# Patient Record
Sex: Female | Born: 1955 | Race: White | Hispanic: Yes | Marital: Single | Smoking: Never smoker
Health system: Southern US, Community
[De-identification: ages and names within clinical notes are randomized; demographics above are authoritative.]

## PROBLEM LIST (undated history)

## (undated) DIAGNOSIS — G43909 Migraine, unspecified, not intractable, without status migrainosus: Secondary | ICD-10-CM

## (undated) DIAGNOSIS — IMO0001 Reserved for inherently not codable concepts without codable children: Secondary | ICD-10-CM

## (undated) DIAGNOSIS — Z5189 Encounter for other specified aftercare: Secondary | ICD-10-CM

## (undated) DIAGNOSIS — I10 Essential (primary) hypertension: Secondary | ICD-10-CM

## (undated) DIAGNOSIS — O034 Incomplete spontaneous abortion without complication: Secondary | ICD-10-CM

## (undated) HISTORY — PX: TUBAL LIGATION: SHX77

## (undated) HISTORY — DX: Essential (primary) hypertension: I10

## (undated) HISTORY — DX: Reserved for inherently not codable concepts without codable children: IMO0001

## (undated) HISTORY — DX: Migraine, unspecified, not intractable, without status migrainosus: G43.909

## (undated) HISTORY — DX: Encounter for other specified aftercare: Z51.89

## (undated) HISTORY — DX: Incomplete spontaneous abortion without complication: O03.4

## (undated) HISTORY — PX: CRYOTHERAPY: SHX1416

## (undated) HISTORY — PX: DILATION AND CURETTAGE OF UTERUS: SHX78

---

## 1999-12-15 ENCOUNTER — Emergency Department (HOSPITAL_COMMUNITY): Admission: EM | Admit: 1999-12-15 | Discharge: 1999-12-15 | Payer: Self-pay | Admitting: Emergency Medicine

## 2000-04-02 ENCOUNTER — Other Ambulatory Visit: Admission: RE | Admit: 2000-04-02 | Discharge: 2000-04-02 | Payer: Self-pay | Admitting: Gynecology

## 2001-05-28 ENCOUNTER — Other Ambulatory Visit: Admission: RE | Admit: 2001-05-28 | Discharge: 2001-05-28 | Payer: Self-pay | Admitting: Gynecology

## 2003-11-26 ENCOUNTER — Other Ambulatory Visit: Admission: RE | Admit: 2003-11-26 | Discharge: 2003-11-26 | Payer: Self-pay | Admitting: Gynecology

## 2004-01-06 ENCOUNTER — Encounter: Admission: RE | Admit: 2004-01-06 | Discharge: 2004-01-06 | Payer: Self-pay | Admitting: Gynecology

## 2004-06-26 ENCOUNTER — Emergency Department (HOSPITAL_COMMUNITY): Admission: EM | Admit: 2004-06-26 | Discharge: 2004-06-26 | Payer: Self-pay | Admitting: Family Medicine

## 2005-01-12 ENCOUNTER — Other Ambulatory Visit: Admission: RE | Admit: 2005-01-12 | Discharge: 2005-01-12 | Payer: Self-pay | Admitting: Gynecology

## 2006-01-18 ENCOUNTER — Ambulatory Visit: Payer: Self-pay | Admitting: Family Medicine

## 2006-01-22 ENCOUNTER — Other Ambulatory Visit: Admission: RE | Admit: 2006-01-22 | Discharge: 2006-01-22 | Payer: Self-pay | Admitting: Gynecology

## 2006-03-20 ENCOUNTER — Ambulatory Visit: Payer: Self-pay | Admitting: Internal Medicine

## 2006-03-26 ENCOUNTER — Ambulatory Visit: Payer: Self-pay | Admitting: Cardiovascular Disease

## 2006-04-12 ENCOUNTER — Ambulatory Visit: Payer: Self-pay | Admitting: Internal Medicine

## 2006-06-11 HISTORY — PX: SHOULDER SURGERY: SHX246

## 2007-01-31 ENCOUNTER — Other Ambulatory Visit: Admission: RE | Admit: 2007-01-31 | Discharge: 2007-01-31 | Payer: Self-pay | Admitting: Gynecology

## 2007-02-25 ENCOUNTER — Ambulatory Visit (HOSPITAL_BASED_OUTPATIENT_CLINIC_OR_DEPARTMENT_OTHER): Admission: RE | Admit: 2007-02-25 | Discharge: 2007-02-26 | Payer: Self-pay | Admitting: Orthopedic Surgery

## 2008-02-04 ENCOUNTER — Other Ambulatory Visit: Admission: RE | Admit: 2008-02-04 | Discharge: 2008-02-04 | Payer: Self-pay | Admitting: Gynecology

## 2008-02-12 ENCOUNTER — Ambulatory Visit: Payer: Self-pay | Admitting: Gynecology

## 2008-04-23 ENCOUNTER — Ambulatory Visit: Payer: Self-pay | Admitting: Gynecology

## 2009-02-02 ENCOUNTER — Ambulatory Visit (HOSPITAL_COMMUNITY): Admission: RE | Admit: 2009-02-02 | Discharge: 2009-02-03 | Payer: Self-pay | Admitting: Orthopedic Surgery

## 2009-03-08 ENCOUNTER — Ambulatory Visit: Payer: Self-pay | Admitting: Gynecology

## 2009-03-08 ENCOUNTER — Encounter: Payer: Self-pay | Admitting: Gynecology

## 2009-03-08 ENCOUNTER — Other Ambulatory Visit: Admission: RE | Admit: 2009-03-08 | Discharge: 2009-03-08 | Payer: Self-pay | Admitting: Gynecology

## 2009-03-17 ENCOUNTER — Ambulatory Visit: Payer: Self-pay | Admitting: Gynecology

## 2009-09-13 ENCOUNTER — Ambulatory Visit: Payer: Self-pay | Admitting: Gynecology

## 2010-04-14 ENCOUNTER — Encounter (INDEPENDENT_AMBULATORY_CARE_PROVIDER_SITE_OTHER): Payer: Self-pay | Admitting: *Deleted

## 2010-04-14 LAB — CONVERTED CEMR LAB
AST: 23 units/L (ref 0–37)
Albumin: 4.4 g/dL (ref 3.5–5.2)
Alkaline Phosphatase: 69 units/L (ref 39–117)
Basophils Relative: 0 % (ref 0–1)
Eosinophils Absolute: 0 10*3/uL (ref 0.0–0.7)
Free Thyroxine Index: 2.8 (ref 1.0–3.9)
Glucose, Bld: 88 mg/dL (ref 70–99)
Lymphs Abs: 2.2 10*3/uL (ref 0.7–4.0)
MCV: 94 fL (ref 78.0–100.0)
Neutrophils Relative %: 55 % (ref 43–77)
Platelets: 259 10*3/uL (ref 150–400)
Potassium: 4.1 meq/L (ref 3.5–5.3)
Sodium: 143 meq/L (ref 135–145)
T3 Uptake Ratio: 37.1 % — ABNORMAL HIGH (ref 22.5–37.0)
T4, Total: 7.5 ug/dL (ref 5.0–12.5)
Total Protein: 7.2 g/dL (ref 6.0–8.3)
WBC: 5.8 10*3/uL (ref 4.0–10.5)

## 2010-04-25 ENCOUNTER — Ambulatory Visit (HOSPITAL_COMMUNITY): Admission: RE | Admit: 2010-04-25 | Discharge: 2010-04-25 | Payer: Self-pay | Admitting: Internal Medicine

## 2010-05-24 ENCOUNTER — Ambulatory Visit (HOSPITAL_COMMUNITY)
Admission: RE | Admit: 2010-05-24 | Discharge: 2010-05-24 | Payer: Self-pay | Source: Home / Self Care | Attending: Family Medicine | Admitting: Family Medicine

## 2010-05-24 ENCOUNTER — Encounter (INDEPENDENT_AMBULATORY_CARE_PROVIDER_SITE_OTHER): Payer: Self-pay | Admitting: *Deleted

## 2010-05-24 LAB — CONVERTED CEMR LAB: LDL Cholesterol: 109 mg/dL — ABNORMAL HIGH (ref 0–99)

## 2010-09-16 LAB — CBC
HCT: 41.8 % (ref 36.0–46.0)
MCHC: 33.8 g/dL (ref 30.0–36.0)
MCV: 92.9 fL (ref 78.0–100.0)
MCV: 94.1 fL (ref 78.0–100.0)
RBC: 4.42 MIL/uL (ref 3.87–5.11)
RBC: 4.44 MIL/uL (ref 3.87–5.11)
WBC: 5.5 10*3/uL (ref 4.0–10.5)

## 2010-09-16 LAB — BASIC METABOLIC PANEL
BUN: 6 mg/dL (ref 6–23)
CO2: 27 mEq/L (ref 19–32)
Chloride: 104 mEq/L (ref 96–112)
Chloride: 106 mEq/L (ref 96–112)
Creatinine, Ser: 0.67 mg/dL (ref 0.4–1.2)
GFR calc Af Amer: >60 mL/min (ref >60)
GFR calc Af Amer: >60 mL/min (ref >60)
Potassium: 4.4 mEq/L (ref 3.5–5.1)

## 2010-10-24 NOTE — Op Note (Signed)
NAMEAIVAH, PUTMAN                   ACCOUNT NO.:  [id number]   MEDICAL RECORD NO.:  [id number]          PATIENT TYPE:  OIB   LOCATION:  5024                         FACILITY:  [facility]   PHYSICIAN:  Alvy Beal, MD    DATE OF BIRTH:  [birth year]   DATE OF PROCEDURE:  02/02/2009  DATE OF DISCHARGE:                               OPERATIVE REPORT   PREOPERATIVE DIAGNOSIS:  Degenerative cervical disk disease C3-4, 4-5  with large C3-4 exostosis.   POSTOPERATIVE DIAGNOSIS:  Degenerative cervical disk disease C3-4, 4-5  with large C3-4 exostosis.   OPERATIVE PROCEDURE:  Anterior cervical diskectomy and fusion C3-4, 4-5.   COMPLICATIONS:  None.   CONDITION:  Stable.   FIRST ASSISTANT:  Crissie Reese, PA   HISTORY:  This is a very pleasant 55 year old woman who has had  longstanding cervical and left arm pain.  Attempts at conservative  management including physical therapy, activity modification, narcotic,  and nonnarcotic medications and injections had failed to alleviate her  symptoms.  After discussing treatment options, she elected to proceed  with surgery.  All appropriate risks, benefits, and alternatives were  discussed and consent was obtained.   OPERATIVE NOTE:  The patient was brought to the operating room, placed  supine on the operating table.  After successful induction of general  anesthesia endotracheal intubation, TED and SCDs were applied.  A towel  was placed between the shoulder blades and the arms were secured down  with tape.  The anterior cervical spine was prepped and draped in a  standard fashion.   At this point in time, appropriate preoperative time-out was done  confirming patient, procedure, and extremity of pain.  Once done, I made  a transverse incision centered at the L4 vertebral body.  Sharp  dissection was carried out down to and through the platysma.  I then  sharply dissected along the medial border of the sternocleidomastoid,  performing a  standard Clementeen Graham approach to the anterior cervical  spine.  I was able to release the omohyoid from its sleeve and thereby  prevented the need to sacrifice it.  I then swept the trachea and  esophagus medially with my finger, palpated and visualized the carotid  sheath laterally with my other finger and protected it.  At this point,  I could clearly visualize the anterior cervical spine.  I then placed a  needle into the C4-5 disk space and took an intraoperative x-ray.  Once  I confirmed this appropriate level, I began mobilizing the anterior  longitudinal ligament from the midbody of C4 up to the very large  anterior osteophyte (exostosis) at C3-4.  I swept the longus coli  muscles out so I could visualize the uncovertebral joints.  At this  point, I used a double-action Leksell rongeur to remove the large  exostosis at C3-4.  Once this was done, I was able to continue to  dissect the longus coli muscles.  At this point, I placed a self-  retaining retractor into the wound Osie Bond) deflated the endotracheal  cuff and expanded the blades  to the appropriate width.  I then  reexpanded the cuff and then began with the diskectomy.   A 15 blade scalpel was used to incise the annulus, then using a  combination of pituitary rongeurs, curettes, and Kerrison rongeurs, I  resected all the disk material at C3-4.  I also took down a significant  anterior overhanging osteophyte at the inferior aspect of C3.   I then used fine curettes to develop a plane underneath the posterior  longitudinal ligament and then using a 1-mm Kerrison, I resected the  posterior longitudinal ligament to expose the underlying thecal sac.  I  then resected the overhanging osteophytes at the uncovertebral joint  with a 1-mm Kerrison.  At this point with the decompression completed, I  then placed a third distraction pin into the body of C5 and distracted  the 4-5 disk space.  Using the same technique, I did a 3-4 and  performed  a complete diskectomy at C4-5.  Once both diskectomies are complete, the  posterior longitudinal ligaments were resected to adequately decompress  posteriorly.  I then measured the interbody space at 4-5 and placed a 6-  mm precut fibular allograft strut packed with Actifuse.  I had initially  measured the 3-4 to be 7 and I placed a 7-mm graft; however, I noted  that with the 7-mm graft, there was over distraction and it did push the  vertebral body into slight extension.  At this point because of the  deformity that the large graft placed, I removed it and then took a 6-mm  graft, packed it with Actifuse, and place it at C3-4.  I had better  overall cervical alignment with the 6-mm graft.  At this point, I then  irrigated the wound copiously with normal saline and removed the  distraction pins and used bone wax to obtain hemostasis at the  distraction pin holes.  I then contoured a 32-mm Synthes vector anterior  cervical plate and secured it to the vertebral bodies with 40-mm  variable angle screws.   At the end of the case, I swept the wound and lateral border of the  plate to ensure the esophagus was not entrapped beneath the plate.  I  copiously irrigated with normal saline and returned the trachea and  esophagus back to midline.  I then closed the platysma with interrupted  2-0 Vicryl sutures, and then the skin with a 3-0 Monocryl.  Steri-  Strips, dry dressing were applied.  The patient was extubated,  transferred to the PACU without incident.  At the end of the case, all  needle and sponge counts were correct.  The patient tolerated the  procedure well.  She was transferred to the PACU with an Aspen collar  on.  Instrumentation used was a Synthes anterior cervical vector plate  with 6-mm interbody grafts, precut fibular lordotic with 40-mm locking  screws.      Alvy Beal, MD  Electronically Signed     DDB/MEDQ  D:  02/02/2009  T:  02/03/2009  Job:   [id number]

## 2010-10-24 NOTE — Op Note (Signed)
NAMEGEET, HOSKING                   ACCOUNT NO.:  [id number]   MEDICAL RECORD NO.:  [id number]          PATIENT TYPE:  AMB   LOCATION:  DSC                          FACILITY:  [facility]   PHYSICIAN:  Marlowe Kays, M.D.  DATE OF BIRTH:  Nov 25, 1955   DATE OF PROCEDURE:  02/25/2007  DATE OF DISCHARGE:                               OPERATIVE REPORT   PREOPERATIVE DIAGNOSIS:  1. Degenerative arthritis acromioclavicular joint.  2  Torn rotator cuff, left shoulder.   POSTOPERATIVE DIAGNOSIS:  1. Degenerative arthritis acromioclavicular joint.  2  Torn rotator cuff, left shoulder.   OPERATION:  1. Open distal clavicle resection.  2. Anterior acromionectomy repair of torn rotator cuff, left shoulder.   SURGEON:  Marlowe Kays, M.D.   ASSISTANTDruscilla Brownie. Cherlynn June.   ANESTHESIA:  General.   PATHOLOGY/INDICATIONS FOR PROCEDURE:  She injured her left shoulder in  July of this year at work.  Because of persistent pain, we saw her on  January 15, 2007.  Her x-rays demonstrated a type 2 acromion with spurring  at the [facility] joint.  She was sent for an MRI which was performed on August  29, which demonstrated a rotator cuff tear, small in the full-thickness  of the anterior supraspinatus tendon along with AC joint degenerative  arthritis.  Because of the above, she is here today for the above-  mentioned surgery.  See operative description below for additional  details.   PROCEDURE:  Prophylactic antibiotics, satisfactory general anesthesia,  beach-chair position on the __________  frame.  Left  shoulder was  prepped with DuraPrep and draped in a sterile field.  IV employed.  Time-  out performed.  I made an incision over the distal clavicle and then  curving just past the anterior acromion. With subperiosteal dissection,  I isolated the AC joint and with subperiosteal dissection underneath the  distal clavicle, measured off a 1.5 from the [facility] joint, marked the  clavicle at this point  and then protecting the underlying tissues, used  a micro saw to remove the distal clavicle with towel clip and cutting  cautery technique.  Several small spicules of bone underneath the  surface of the remaining clavicle were excised with small rongeur.  I  then  extended my incision distally with subperiosteal dissection over  the anterior acromion.  She had a good bit of tightness with impingement  problem.  I placed a small Cobb elevator beneath the anterior acromion  and resected the initial portion of the anterior acromion, making two  further passes with a micro saw and rongeur to completely decompress the  anterior acromion.  She had a very thick bursa in keeping with the MRI  which I excised.  The rotator cuff tear was not immediately apparent.  The MRI had indicated it was somewhat lateral.  It did appear to be a  slight indented area near the greater tuberosity, but this was not  definite.  Accordingly I mixed 5 mL of methylene blue with 10 mL of  saline and injected through the posterior capsule with dye,  exiting in  the actual muscle of the supraspinatus tendon, more than in the tendon  itself, up beneath the acromion and distal clavicle resection site.  There was no evidence for any extravasation of dye lateralward in the  rotator cuff.  I then made further dissection of soft tissue beneath the  distal clavicle resection site.  She had a good bit of reparative type  tissue which would be consistent with a tear at this location and having  cleared that up and correcting some minor bleeding, I repaired the  defect as evidenced by methylene blue with multiple parallel sutures of  #1 Vicryl.  Then checked for impingement and there was no residual  impingement.  There was no unusual bleeding.  The wound was irrigated  well with sterile saline.  I had already placed bone wax over the distal  clavicle resection site.  I then placed Gelfoam in the area of the  distal clavicle  resection and closed the wound with interrupted #1  Vicryl in the fascia over the distal clavicle and anterior acromion, 2-0  Vicryl subcutaneous tissue.  Steri-Strips on the skin.  Dry sterile  dressing, shoulder immobilizer were applied.  She tolerated the  procedure well and was taken to recovery room in satisfactory condition.  There were no known complications.           ______________________________  Marlowe Kays, M.D.     JA/MEDQ  D:  02/25/2007  T:  02/25/2007  Job:  [id number]

## 2010-10-27 NOTE — Letter (Signed)
February 18, 2006     [facility]  [address]  [place], [place] [address]   RE:  Kelly Cruz, Kelly Cruz  MRN:  [id number]  /  DOB:  1955-10-24   Dear. Ms. Mena,   We have attempted several times to communicate with you regarding your  appointment with Cardiology.  We had to cancel the first appointment due to  the inability to contact you and, therefore, rescheduled an appointment for  February 08, 2006.  Your appointment will be with Dr. Antoine Poche at [address]; [place], [place].  Phone number is [phone].   If you are unable to keep this appointment, please call Cardiology to  reschedule the appointment.    Sincerely,      Leanne Chang, M.D.   LA/MedQ  DD:  02/18/2006  DT:  02/18/2006  Job #:  [id number]

## 2010-10-27 NOTE — Letter (Signed)
March 26, 2006     Leanne Chang, M.D.  [phone] [place], [place] [address].   RE:  ALAHNI, VARONE  MRN:  [id number]  /  DOB:  [birth year]   Dear Dr. Blossom Hoops:   It was my pleasure to see Kelly Cruz at the [facility] this  morning.  As you know, she is a very pleasant 55 year old woman originally  from [place] who presents for evaluation of chest pain and palpitations.  She complains of intermittent episodes of chest discomfort, shortness of  breath, and palpitations.  She does not have a prominent component of pain  in the chest, but does have an abnormal feeling there that seems to be  associated with palpitations.  Her symptoms are non-exertional in nature.  She has some dull discomfort in the chest on an intermittent basis.  She has  not had syncope, orthopnea, PND, edema, or claudication symptoms.  She has  not had any previous evaluation for cardiac disease and has no prior cardiac  history.  Kelly Cruz does not speak English, and translation was done through  her son, who was present with her today.  It was somewhat difficult to  elicit an accurate history on my evaluation today.   PAST MEDICAL HISTORY:  Pertinent only for migraine headaches.  She has had  no previous surgeries and has no other medical problems.   SOCIAL HISTORY:  The patient is originally from [place].  She lived in  [place] and has now been in [place] for several years.  She lives  locally in [place].  She does not smoke cigarettes, drink alcohol, or use  recreational drugs.  She does not drink caffeinated beverages.   FAMILY HISTORY:  Her mother is alive, but has heart problems.  Her father is  alive and has not had any problems related to his heart.  She has 7 siblings  who are healthy.   CURRENT MEDICATIONS:  1. Naproxen 500 mg as needed.  2. Ketoprofen 75 mg as needed.   Complete 12-point review of systems was performed.  The only pertinent  positive  was migraine headaches and occasional calf pain.  There are no  other positive findings to report.   PHYSICAL EXAMINATION:  The patient is alert and oriented.  She is in no  acute distress.  Her blood pressure is 140/72, heart rate 66.  Her weight is 154 pounds.  Respiratory rate is 12.  EYES:  Sclerae anicteric.  Conjunctivae pink.  Pupils equal, round, and  reactive to light.  ENT:  Moist oral mucosa.  Oropharynx is clear.  NECK:  Normal carotid upstrokes without bruits.  Jugular venous pressure is  normal.  LUNGS:  Clear to auscultation bilaterally.  CHEST:  There is a superficial nodule around the left axillary region.  This  feels more like a superficial cyst than a lymph node.  CARDIOVASCULAR:  The apex is discrete and nondisplaced.  The heart is  regular rate and rhythm without murmurs or gallops.  ABDOMEN:  Soft and nontender.  No organomegaly.  No abdominal bruits.  EXTREMITIES:  No cyanosis, clubbing, or edema.  Peripheral pulses are 2+ and  equal throughout.  SKIN:  Warm and dry.  LYMPHATICS:  There is no adenopathy.  NEUROLOGIC:  Strength is 5/5 and equal in the arms and legs bilaterally.   ELECTROCARDIOGRAM:  Normal sinus rhythm and it is within normal limits.   LABORATORY DATA:  Reviewed from August 10 of this  year.  She had a normal  CBC, as well as a normal comprehensive metabolic panel.  Her total  cholesterol was 180, her HDL was 51, and her LDL was 113.   ASSESSMENT:  Kelly Cruz is a 55 year old woman with a family history of  coronary artery disease, presenting with chest discomfort and palpitations.  My overall impression is that her pain is atypical and likely non-cardiac.  However, I do not feel confident based on our language barrier and my  difficulty obtaining an accurate history that she is not having any ischemic  cardiac pain.  With her family history, I would like to rule out significant  myocardial ischemia with an exercise Myoview stress test.  I will  plan on  following up with her by telephone after the results of this stress test are  completed.  If she has any abnormal findings, I will see her back in the  clinic.   Regarding the abnormal physical exam finding with the superficial cystic  mass in her left axilla, I have asked that she followup with you regarding  this.  I stressed the importance of this to both  her and her son.  They are going to contact your office for a followup  visit.  She first noticed this area approximately 3 years ago but forgot to  mention it to you on your initial exam.   Dr. Blossom Hoops, thanks once again for allowing me to evaluate Kelly Cruz.  I  will be in contact with you if there are any problems with her stress test.  Please feel free to call me at any time with questions regarding her care.    Sincerely,      Veverly Fells. Excell Seltzer, MD    MDC/MedQ  /  Job #:  [id number]  DD:  03/26/2006 / DT:  03/27/2006

## 2011-03-22 LAB — POCT HEMOGLOBIN-HEMACUE: Operator id: 123881

## 2011-04-26 ENCOUNTER — Encounter: Payer: Self-pay | Admitting: Gynecology

## 2011-04-27 ENCOUNTER — Other Ambulatory Visit (HOSPITAL_COMMUNITY)
Admission: RE | Admit: 2011-04-27 | Discharge: 2011-04-27 | Disposition: A | Discharge status: Home / Self Care | Payer: Medicare Other | Source: Ambulatory Visit | Attending: Gynecology | Admitting: Gynecology

## 2011-04-27 ENCOUNTER — Encounter: Payer: Self-pay | Admitting: Gynecology

## 2011-04-27 ENCOUNTER — Ambulatory Visit (INDEPENDENT_AMBULATORY_CARE_PROVIDER_SITE_OTHER): Payer: Medicare Other | Admitting: Gynecology

## 2011-04-27 VITALS — BP 154/92 | Ht 62.75 in | Wt 146.0 lb

## 2011-04-27 DIAGNOSIS — R82998 Other abnormal findings in urine: Secondary | ICD-10-CM

## 2011-04-27 DIAGNOSIS — Z1211 Encounter for screening for malignant neoplasm of colon: Secondary | ICD-10-CM

## 2011-04-27 DIAGNOSIS — R5381 Other malaise: Secondary | ICD-10-CM

## 2011-04-27 DIAGNOSIS — N9089 Other specified noninflammatory disorders of vulva and perineum: Secondary | ICD-10-CM

## 2011-04-27 DIAGNOSIS — R5383 Other fatigue: Secondary | ICD-10-CM

## 2011-04-27 DIAGNOSIS — E119 Type 2 diabetes mellitus without complications: Secondary | ICD-10-CM | POA: Insufficient documentation

## 2011-04-27 DIAGNOSIS — Z01419 Encounter for gynecological examination (general) (routine) without abnormal findings: Secondary | ICD-10-CM

## 2011-04-27 DIAGNOSIS — E559 Vitamin D deficiency, unspecified: Secondary | ICD-10-CM

## 2011-04-27 DIAGNOSIS — I10 Essential (primary) hypertension: Secondary | ICD-10-CM

## 2011-04-27 DIAGNOSIS — Z124 Encounter for screening for malignant neoplasm of cervix: Secondary | ICD-10-CM

## 2011-04-27 DIAGNOSIS — F329 Major depressive disorder, single episode, unspecified: Secondary | ICD-10-CM

## 2011-04-27 DIAGNOSIS — K921 Melena: Secondary | ICD-10-CM

## 2011-04-27 LAB — HEMOGLOBIN A1C: Mean Plasma Glucose: 117 mg/dL — ABNORMAL HIGH (ref <117)

## 2011-04-27 NOTE — Progress Notes (Signed)
Kelly Cruz 1955-10-11 [id number]   History:    55 y.o.  for annual exam with several complaints. Patient noted a vulvar growth recently and wanted to have it checked she has not seen her primary physician at the [facility] department in over year. Patient has history of hypertension for which she did not take her medicine today. Has a history of type 2 diabetes. And also to take nonsteroidals for joint pains. Review of her record indicated her last mammogram was in 2010 her last bone density study was normal in 2010. She has not had a colonoscopy yet. Patient also complaining of dryness fatigue and depressed and lack of energy. Patient did not have her list of her medications that she's on and did not recall the name of such medications for hypertension and her diabetes. She also has had history of vitamin D deficiency in the past.  Past medical history,surgical history, family history and social history were all reviewed and documented in the EPIC chart.  Gynecologic History Patient's last menstrual period was 04/26/2005. Contraception: none Last Pap: 2010. Results were:normal} Last mammogram: 2010. Results were: normal  Obstetric History OB History    Grav Para Term Preterm Abortions TAB SAB Ect Mult Living   10 6 5  4     6      # Outc Date GA Lbr Len/2nd Wgt Sex Del Anes PTL Lv   1 TRM     M SVD  No Yes   2 TRM     M SVD  No Yes   3 TRM     M SVD  No Yes   4 TRM     M SVD  No Yes   5 TRM     F SVD  No Yes   6 PAR     M SVD  No Yes   Comments: BABY DIED SAME DAY..   7 ABT            8 ABT            9 ABT            10 ABT                ROS:  Was performed and pertinent positives and negatives are included in the history.  Exam: chaperone present  BP 154/92  Ht 5' 2.75" (1.594 m)  Wt 146 lb (66.225 kg)  BMI 26.07 kg/m2  LMP 04/26/2005  Body mass index is 26.07 kg/(m^2).  General appearance : Well developed well nourished female. No acute distress HEENT: Neck supple,  trachea midline, no carotid bruits, no thyroidmegaly Lungs: Clear to auscultation, no rhonchi or wheezes, or rib retractions  Heart: Regular rate and rhythm, no murmurs or gallops Breast:Examined in sitting and supine position were symmetrical in appearance, no palpable masses or tenderness,  no skin retraction, no nipple inversion, no nipple discharge, no skin discoloration, no axillary or supraclavicular lymphadenopathy Abdomen: no palpable masses or tenderness, no rebound or guarding Extremities: no edema or skin discoloration or tenderness  Pelvic:  Bartholin, Urethra, Skene Glands: Within normal limits             Vagina: An apparent fibroepithelial skin tag on the             upper portion of the right labia majora.  Cervix: No gross lesions or discharge  Uterus  anteverted, normal size, shape and  consistency, non-tender and mobile  Adnexa  Without masses or tenderness  Anus and perineum  normal   Rectovaginal  normal sphincter tone without palpated masses or tenderness             Hemoccult obtained results pending at time of this dictation     Assessment/Plan:  55 y.o. female  with a fibroepithelial skin tag in the upper portion of the right labia majora which patient would like to have removed. She will schedule that in the next couple weeks. Patient with history of hypertension and type 2 diabetes did not take her medications today. Patient did not bring her list of medications that she's on. She states that she's been followed at the [facility] department but has not seen her provider in a year. She's also feels depressed tired and lack of energy. No suicidal deviation. Will check the following labs: TSH, hemoglobin A1c, CBC, urinalysis, vitamin D level (history vitamin D deficiency) fecal occult blood testing along with her Pap smear. A requisition will be given to her to schedule her bone density study here in the office as well and the names of colleagues to  schedule her colonoscopy as well as a family physician or internist to follow her  for her diabetes and hypertension.   Ok Edwards MD, 4:11 PM 04/27/2011

## 2011-05-02 ENCOUNTER — Encounter: Payer: Self-pay | Admitting: Gynecology

## 2011-05-10 ENCOUNTER — Ambulatory Visit (INDEPENDENT_AMBULATORY_CARE_PROVIDER_SITE_OTHER): Payer: Medicare Other | Admitting: Gynecology

## 2011-05-10 ENCOUNTER — Encounter: Payer: Self-pay | Admitting: Gynecology

## 2011-05-10 VITALS — BP 152/90

## 2011-05-10 DIAGNOSIS — N9089 Other specified noninflammatory disorders of vulva and perineum: Secondary | ICD-10-CM

## 2011-05-10 NOTE — Progress Notes (Signed)
55 year old patient who was seen for her gynecological examination last week had brought to my attention she was concerned of a vulvar lesion that she had noted for the past year. Her recent lab work in the office were as follows: TSH, hemoglobin A1c, CBC, urinalysis, and Pap smear were normal. Her vitamin D was slightly low and she was instructed to start taking vitamin D 3 2000 units daily. Patient with history of hypertension had been seen at [facility] by a Dr. Clelia Croft and she has not followed up and quite some time. Also during her annual exam her fecal occult blood testing was found to be positive and we are going to make arrangements for her to see a gastroenterologist. She has not had a bone density study and the will be scheduled here in our office the next week or so.  Vulvar examination with the following findings:  Physical Exam  Genitourinary:      the area was cleansed with Betadine solution and 1% lidocaine was infiltrated at the base a small elliptical incision was made to excise the fibroepithelial growth and submitted for histological evaluation. Silver nitrate was used for hemostasis and the edges of the incision were reapproximated with 2 interrupted sutures of 3-0 Vicryl. 1% lidocaine was applied topically. She'll return back next week to remove her sutures. I've given her name was of some my colleagues in the community [facility]) so that they can follow her for her hypertension. Will make arrangements for her to see a gastroenterologist for screening colonoscopy as well as because of her recent positive fecal occult blood testing. All the above was discussed with the patient Spanish and we'll follow accordingly.

## 2011-05-11 ENCOUNTER — Telehealth: Payer: Self-pay | Admitting: *Deleted

## 2011-05-11 ENCOUNTER — Encounter: Payer: Self-pay | Admitting: Internal Medicine

## 2011-05-11 ENCOUNTER — Other Ambulatory Visit: Payer: Self-pay | Admitting: *Deleted

## 2011-05-11 DIAGNOSIS — R195 Other fecal abnormalities: Secondary | ICD-10-CM

## 2011-05-11 NOTE — Telephone Encounter (Signed)
Message copied by Libby Maw on Fri May 11, 2011  3:47 PM ------      Message from: Ok Edwards      Created: Thu May 10, 2011  5:40 PM       Khriz Liddy, I need to make an appointment for this patient for her colonoscopy she is a medicated patient. Try to check first with a [facility] and then keep checking to see who would see her. She is a screening colonoscopy but also she had positive Hemoccult stool in the office recently.

## 2011-05-11 NOTE — Telephone Encounter (Signed)
Patient called by [facility] CMA.  Informed appointment information, Nurse visit 05/18/11 @2 :30pm and Colonoscopy 06/01/11 @3 :00pm with Dr. Lina Sar.

## 2011-05-16 ENCOUNTER — Ambulatory Visit (INDEPENDENT_AMBULATORY_CARE_PROVIDER_SITE_OTHER): Payer: Medicare Other | Admitting: Gynecology

## 2011-05-16 ENCOUNTER — Encounter: Payer: Self-pay | Admitting: Gynecology

## 2011-05-16 VITALS — BP 132/86

## 2011-05-16 DIAGNOSIS — Z4802 Encounter for removal of sutures: Secondary | ICD-10-CM

## 2011-05-16 DIAGNOSIS — D229 Melanocytic nevi, unspecified: Secondary | ICD-10-CM

## 2011-05-16 DIAGNOSIS — D239 Other benign neoplasm of skin, unspecified: Secondary | ICD-10-CM

## 2011-05-16 NOTE — Progress Notes (Signed)
Patient presented to the office today to remove sutures from the previous excised vulvar lesion on November 29. The area that was excised was a growth on the upper aspect of the right labia majora see detail picture of previous office visit. Pathology report as follows:  FINAL DIAGNOSIS Diagnosis Vulva, biopsy, right labia majora - MELANOCYTIC NEVUS, COMPOUND TYPE, MARGIN INVOLVED   There is a proliferation of benign melanocytes at the dermal epidermal junction arranged mostly in nests with nevus cells in the dermis, which are generally smaller as they are deeper. There is no significant atypia. The lesion extends to the biopsy margin  Sutures were removed areas healing nicely she's otherwise scheduled to return back next year or when necessary. It was stressed upon her to followup with her family to continue to monitor her hypertension as well as to schedule her mammogram and colonoscopy as previously discussed. All the above was discussed in Spanish and we'll follow accordingly.

## 2011-05-24 ENCOUNTER — Ambulatory Visit (AMBULATORY_SURGERY_CENTER): Payer: Medicare Other | Admitting: *Deleted

## 2011-05-24 VITALS — Ht 65.0 in | Wt 150.0 lb

## 2011-05-24 DIAGNOSIS — Z1211 Encounter for screening for malignant neoplasm of colon: Secondary | ICD-10-CM

## 2011-05-24 MED ORDER — PEG-KCL-NACL-NASULF-NA ASC-C 100 G PO SOLR: ORAL | Status: DC

## 2011-05-24 NOTE — Progress Notes (Signed)
Pt. Became flushed at end of previsit.  Gave her breakfast bar and water.  Instructed her to sit in waiting area or car and rest.  If still feelling light headed to call family to come drive her home and if need to go to ER.  All was explained to pt via interpreter.  Pt. Chose to leave in elevator with interpreter.

## 2011-05-28 IMAGING — RF DG CERVICAL SPINE 2 OR 3 VIEWS
1 series · 3 of 3 positions shown · non-contrast
Comparison: Cervical spine radiographs 01/31/2009

CLINICAL DATA: Neck pain.  Cervical fusion.

CERVICAL SPINE - 2-3 VIEW

[Series 1: run · 3 of 3 slices shown]
[im 1/3]
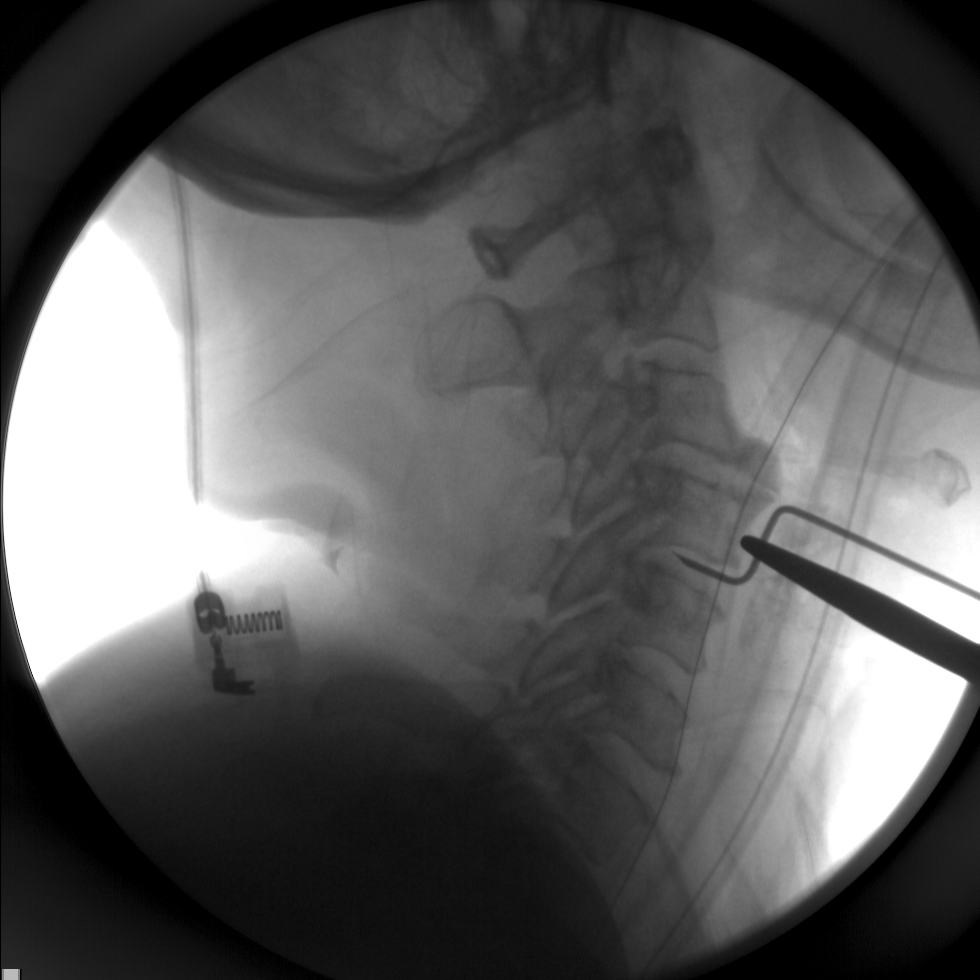
[im 2/3]
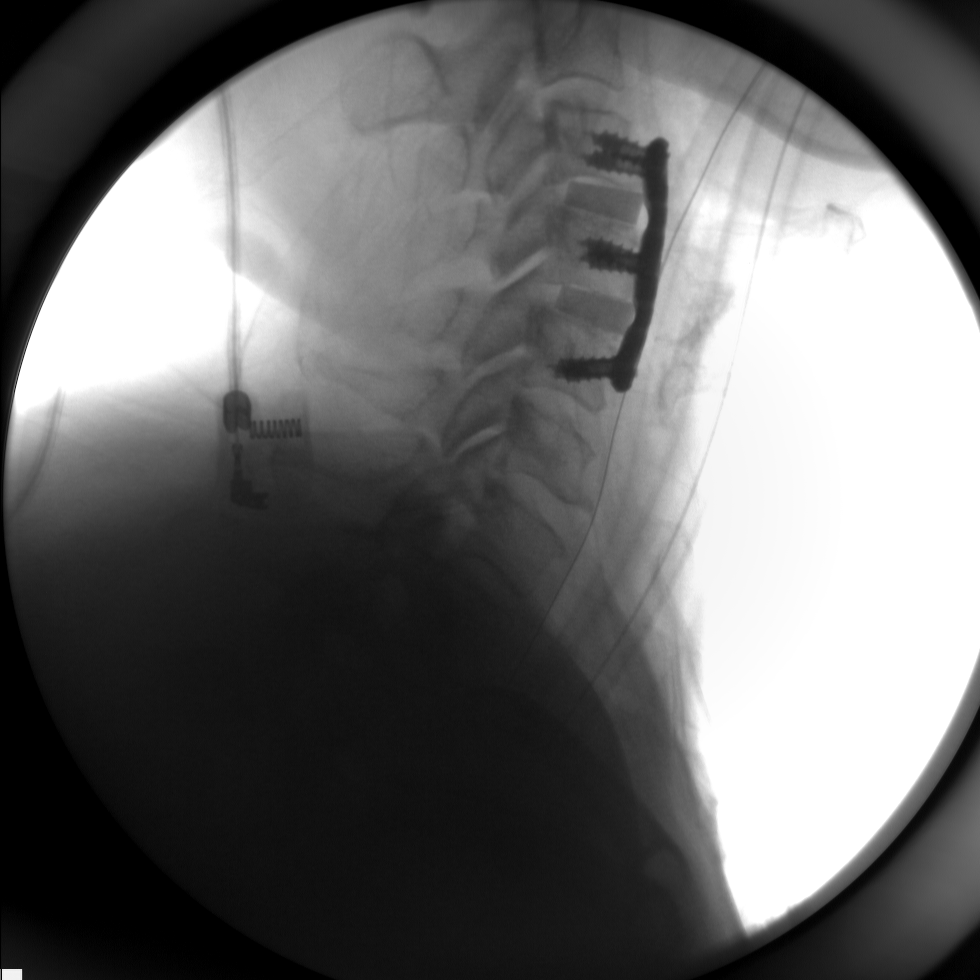
[im 3/3]
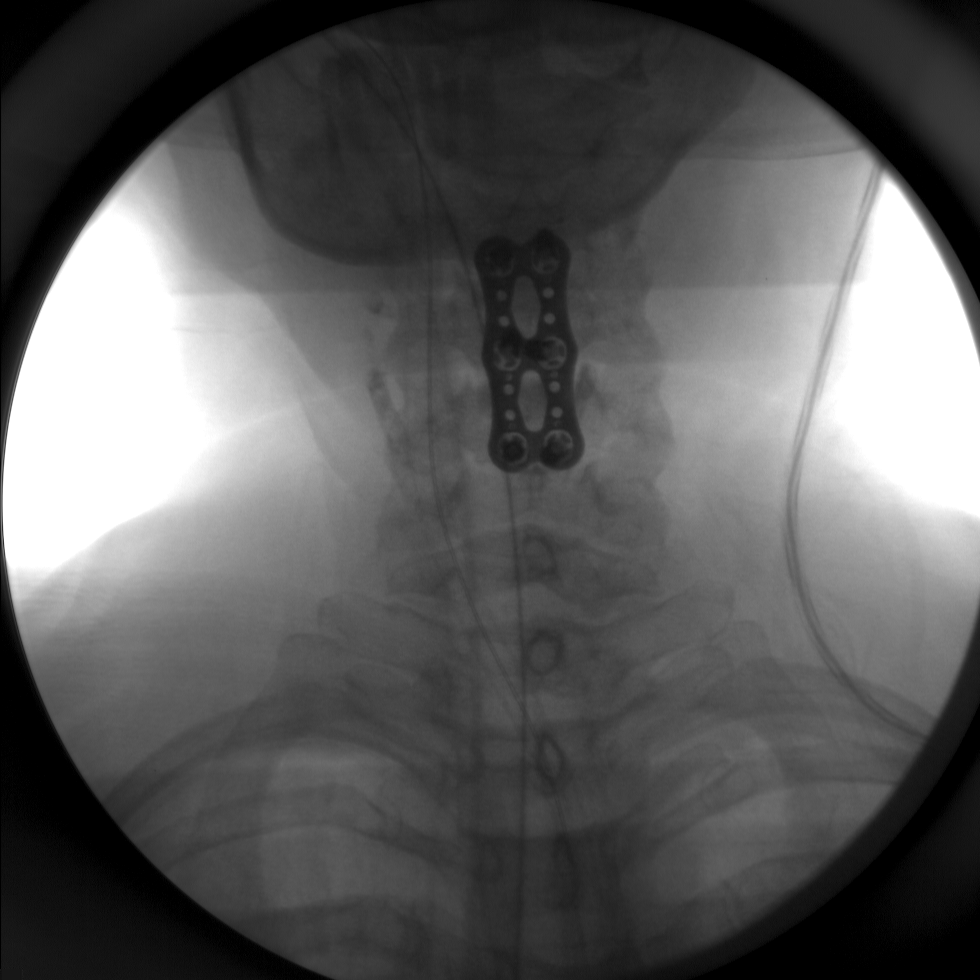

[3 of 3 positions shown; findings below may reference images not displayed]

FINDINGS: The film labeled #1 demonstrates a spinal needle
projecting at the C4-5 interspace.

Films labeled #2 and 3 demonstrate anterior plate screws and
interbody bone plugs fusing C3-C5.  No complicating features are
demonstrated.
IMPRESSION: C3-C5 anterior and interbody fusion.

## 2011-06-01 ENCOUNTER — Encounter: Payer: Self-pay | Admitting: Internal Medicine

## 2011-06-01 ENCOUNTER — Ambulatory Visit (AMBULATORY_SURGERY_CENTER): Payer: Medicare Other | Admitting: Internal Medicine

## 2011-06-01 VITALS — BP 121/73 | HR 72 | Temp 98.9°F | Resp 13 | Ht 65.0 in | Wt 150.0 lb

## 2011-06-01 DIAGNOSIS — D126 Benign neoplasm of colon, unspecified: Secondary | ICD-10-CM

## 2011-06-01 DIAGNOSIS — Z1211 Encounter for screening for malignant neoplasm of colon: Secondary | ICD-10-CM

## 2011-06-01 MED ORDER — SODIUM CHLORIDE 0.9 % IV SOLN: 500.0000 mL | INTRAVENOUS | Status: DC

## 2011-06-01 NOTE — Progress Notes (Signed)
INTERPRETER PRESENT AND CAREGIVER, ONE GENTLEMAN WHO SPOKE FLUENT ENGLISH. DISCHARGE INSTRUCTIONS GIVEN IN SPANISH.PATIENT DISCHARGED COMFORTABLE WITHOUT COMPLAINTS.

## 2011-06-01 NOTE — Patient Instructions (Signed)
FOLLOW YOUR DISCHARGE INSTRUCTIONS.   CONTINUE YOUR MEDICATIONS.  AWAIT BIOPSY RESULTS. 

## 2011-06-01 NOTE — Op Note (Signed)
[facility] [address] [place], [place]  [address]  COLONOSCOPY PROCEDURE REPORT  PATIENT:  Kelly Cruz, Kelly Cruz  MR#:  [id number] BIRTHDATE:  06-12-1955, 55 yrs. old  GENDER:  female ENDOSCOPIST:  Hedwig Morton. Juanda Chance, MD REF. BY:  Reynaldo Minium, M.D. PROCEDURE DATE:  06/01/2011 PROCEDURE:  Colonoscopy with biopsy ASA CLASS:  Class I INDICATIONS:  Routine Risk Screening MEDICATIONS:   These medications were titrated to patient response per physician's verbal order, Versed 7 mg, Fentanyl 75 mcg  DESCRIPTION OF PROCEDURE:   After the risks and benefits and of the procedure were explained, informed consent was obtained. Digital rectal exam was performed and revealed no rectal masses. The LB PCF-H180AL [id number] endoscope was introduced through the anus and advanced to the cecum, which was identified by both the appendix and ileocecal valve.  The quality of the prep was good, using MoviPrep.  The instrument was then slowly withdrawn as the colon was fully examined. <<PROCEDUREIMAGES>>  FINDINGS:  A sessile polyp was found. at 20 cm 8 mm polyp The polyp was removed using cold biopsy forceps (see image7 and image5).  Moderate diverticulosis was found in the sigmoid colon (see image1).  This was otherwise a normal examination of the colon (see image2, image3, image4, and image8).   Retroflexed views in the rectum revealed no abnormalities.    The scope was then withdrawn from the patient and the procedure completed.  COMPLICATIONS:  None ENDOSCOPIC IMPRESSION: 1) Sessile polyp 2) Moderate diverticulosis in the sigmoid colon 3) Otherwise normal examination RECOMMENDATIONS: 1) Await pathology results 2) High fiber diet.  REPEAT EXAM:  In 5 - 7 year(s) for.  5 year recall if polyp adenomatous  ______________________________ Hedwig Morton. Juanda Chance, MD  CC:  n. eSIGNED:   Hedwig Morton. Keighley Deckman at 06/01/2011 04:15 PM  Jamison Neighbor, [id number]

## 2011-06-01 NOTE — Progress Notes (Signed)
Patient did not experience any of the following events: a burn prior to discharge; a fall within the facility; wrong site/side/patient/procedure/implant event; or a hospital transfer or hospital admission upon discharge from the facility. (G8907) Patient did not have preoperative order for IV antibiotic SSI prophylaxis. (G8918)  

## 2011-06-01 NOTE — Progress Notes (Signed)
Pt speaks spanish.  An interpreter from the interpreter service in the procedure room to explain what we were doing.  She was present for the time out and answered questions for the pt.  Maw  Intra procedure the pt's sats dropped to 81%.  Pt encourage to take deep "respira".  Probe was repositioned on her finger sats increased to 85%.  O2 nasal canula to 4 liters nasal canula.  Pt's sats increased to 95%.  Maw  The pt was uncomfortable while the scope was being advanced.  Medications were titrated per MD's orders.  Once the scope was being removed from the cecum, she relaxed and slept with her eyes closed, without any complaints. maw

## 2011-06-04 ENCOUNTER — Telehealth: Payer: Self-pay | Admitting: *Deleted

## 2011-06-04 NOTE — Telephone Encounter (Signed)

## 2011-06-13 ENCOUNTER — Encounter: Payer: Self-pay | Admitting: Internal Medicine

## 2011-06-13 ENCOUNTER — Ambulatory Visit (INDEPENDENT_AMBULATORY_CARE_PROVIDER_SITE_OTHER): Payer: Medicare Other

## 2011-06-13 DIAGNOSIS — M949 Disorder of cartilage, unspecified: Secondary | ICD-10-CM

## 2011-06-13 DIAGNOSIS — E559 Vitamin D deficiency, unspecified: Secondary | ICD-10-CM

## 2011-06-13 DIAGNOSIS — M8580 Other specified disorders of bone density and structure, unspecified site: Secondary | ICD-10-CM

## 2011-06-18 ENCOUNTER — Encounter: Payer: Self-pay | Admitting: Gynecology

## 2012-04-28 ENCOUNTER — Encounter: Payer: Self-pay | Admitting: Gynecology

## 2012-04-28 ENCOUNTER — Ambulatory Visit (INDEPENDENT_AMBULATORY_CARE_PROVIDER_SITE_OTHER): Payer: Medicare Other | Admitting: Gynecology

## 2012-04-28 VITALS — BP 132/88 | Ht 62.25 in | Wt 151.0 lb

## 2012-04-28 DIAGNOSIS — E119 Type 2 diabetes mellitus without complications: Secondary | ICD-10-CM

## 2012-04-28 DIAGNOSIS — I10 Essential (primary) hypertension: Secondary | ICD-10-CM

## 2012-04-28 DIAGNOSIS — E559 Vitamin D deficiency, unspecified: Secondary | ICD-10-CM | POA: Insufficient documentation

## 2012-04-28 DIAGNOSIS — Z23 Encounter for immunization: Secondary | ICD-10-CM

## 2012-04-28 LAB — COMPREHENSIVE METABOLIC PANEL
AST: 15 U/L (ref 0–37)
Alkaline Phosphatase: 46 U/L (ref 39–117)
BUN: 15 mg/dL (ref 6–23)
Creat: 0.77 mg/dL (ref 0.50–1.10)

## 2012-04-28 LAB — LIPID PANEL
Cholesterol: 206 mg/dL — ABNORMAL HIGH (ref 0–200)
HDL: 49 mg/dL (ref >39)
LDL Cholesterol: 141 mg/dL — ABNORMAL HIGH (ref 0–99)
Triglycerides: 81 mg/dL (ref <150)

## 2012-04-28 LAB — CBC WITH DIFFERENTIAL/PLATELET
Basophils Relative: 0 % (ref 0–1)
Eosinophils Relative: 1 % (ref 0–5)
HCT: 39 % (ref 36.0–46.0)
Hemoglobin: 13.3 g/dL (ref 12.0–15.0)
MCH: 31.4 pg (ref 26.0–34.0)
MCHC: 34.1 g/dL (ref 30.0–36.0)
MCV: 92 fL (ref 78.0–100.0)
Monocytes Absolute: 0.3 10*3/uL (ref 0.1–1.0)
Monocytes Relative: 7 % (ref 3–12)
Neutro Abs: 2.2 10*3/uL (ref 1.7–7.7)

## 2012-04-28 NOTE — Patient Instructions (Addendum)
Control del colesterol  Los niveles de colesterol en el organismo estn determinados significativamente por su dieta. Los niveles de colesterol tambin se relacionan con la enfermedad cardaca. El material que sigue ayuda a Software engineer relacin y a Chiropractor qu puede hacer para mantener su corazn sano. No todo el colesterol es [place]. Las lipoprotenas de baja densidad (LDL) forman el colesterol "malo". El colesterol malo puede ocasionar depsitos de grasa que se acumulan en el interior de las arterias. Las lipoprotenas de alta densidad (HDL) es el colesterol "bueno". Ayuda a remover el colesterol LDL "malo" de la [place]. El colesterol es un factor de riesgo muy importante para la enfermedad cardaca. Otros factores de riesgo son la hipertensin arterial, el hbito de fumar, el estrs, la herencia y [place].  El msculo cardaco obtiene el suministro de sangre a travs de las arterias coronarias. Si su colesterol LDL ("malo") est elevado y el HDL ("bueno") es bajo, tiene un factor de riesgo para que se formen depsitos de Holiday representative en las arterias coronarias (los vasos sanguneos que suministran sangre al corazn). Esto hace que haya menos lugar para que la sangre circule. Sin la suficiente sangre y oxgeno, el msculo cardaco no puede funcionar correctamente, y usted podr sentir dolores en el pecho (angina pectoris). Cuando una arteria coronaria se cierra completamente, una parte del msculo cardaco puede morir (infarto de miocardio). CONTROL DEL COLESTEROL Cuando el profesional que lo asiste enva la sangre al laboratorio para Artist nivel de colesterol, puede realizarle tambin un perfil completo de los lpidos. Con esta prueba, se puede determinar la cantidad total de colesterol, as como los niveles de LDL y HDL. Los triglicridos son un tipo de grasa que circula en la sangre y que tambin puede utilizarse para determinar el riesgo de enfermedad  cardaca. En la siguiente tabla se establecen los nmeros ideales: Prueba: Colesterol total  Menos de 200 mg/dl.  Prueba: LDL "colesterol malo"  Menos de 100 mg/dl.   Menos de 70 mg/dl si tiene riesgo muy elevado de sufrir un ataque cardaco o muerte cardaca sbita.  Prueba: HDL "colesterol bueno"  Mujeres: Ms de 50 mg/dl.   Hombres: Ms de 40 mg/dl.  Prueba: Trigliceridos  Menos de 150 mg/dl.  CONTROL DEL COLESTEROL CON DIETA Aunque factores como el ejercicio y el estilo de vida son importantes, la "primera lnea de ataque" es la dieta. Esto se debe a que se sabe que ciertos alimentos hacen subir el colesterol y otros lo [place]. El objetivo debe ser [employer] Foods alimentos, de modo que tengan un efecto sobre el colesterol y, an ms importante, Microbiologist las grasas saturadas y trans con otros tipos de grasas, como las monoinsaturadas y las poliinsaturadas y cidos grasos omega-3 . En promedio, una persona no debe consumir ms de 15 a 17 g de grasas saturadas por [employer]. Las grasas saturadas y trans se consideran grasas "malas", ya que elevan el colesterol LDL. Las grasas saturadas se encuentran principalmente en productos animales como carne, Miles y crema. Pero esto no significa que usted Marketing executive todas sus comidas favoritas. Actualmente, como lo muestra el cuadro que figura al final de este documento, hay sustitutos de buen sabor, bajos en grasas y en colesterol, para la mayora de los alimentos que a usted Musician. Elija aquellos alimentos alternativos que sean bajos en grasas o sin grasas. Elija cortes de carne del cuarto trasero o lomo ya que estos cortes son los que tienen menor cantidad de grasa y Oncologist. El pollo (  sin piel), el pescado, la carne de ternera, y la [place] de [place] molida son excelentes opciones. Elimine las carnes [employer] Foods o el salami. Los Federal[employer]Mogul o nada de grasas saturadas. Cuando consuma carne [place], carne de aves de  corral, o pescado, hgalo en porciones de 85 gramos (3 onzas). Las grasas trans tambin se llaman "aceites parcialmente hidrogenados". Son aceites manipulados cientficamente de [place] que son slidos a Publishing rights manager, tienen una larga vida y Glass blower/designer sabor y la textura de los alimentos a los que se Scientist, clinical (histocompatibility and immunogenetics). Las grasas trans se encuentran en la [place], [place], crackers y alimentos horneados.  Para hornear y cocinar, el aceite es un excelente sustituto para la [place]. Los aceites monoinsaturados tienen un beneficio particular, ya que se cree que disminuyen el colesterol LDL (colesterol malo) y elevan el HDL. Deber evitar los aceites tropicales saturados como el de coco y el de [place].  Recuerde, adems, que puede comer sin restricciones los grupos de alimentos que son naturalmente libres de grasas saturadas y Neurosurgeon trans, entre los que se incluyen el pescado, las frutas (excepto el [place]), verduras, frijoles, cereales (cebada, arroz, [place], trigo) y las pastas (sin salsas con crema)  IDENTIFIQUE LOS ALIMENTOS QUE DISMINUYEN EL COLESTEROL  Pueden disminuir el colesterol las fibras solubles que estn en las frutas, como las [place], en los vegetales como el brcoli, las patatas y las zanahorias; en las legumbres como frijoles, guisantes y Therapist, occupational; y en los cereales como la cebada. Los alimentos fortificados con fitosteroles tambin Engineer, production. Debe consumir al menos 2 g de estos alimentos a diario para Financial planner de disminucin de [place].  En el supermercado, lea las etiquetas de los envases para identificar los alimentos bajos en grasas saturadas, libres de grasas trans y bajos en [place], . Elija quesos que tengan solo de 2 a 3 g de grasa saturada por onza (28,35 g). Use una margarina que no dae el corazn, [place] de grasas trans o aceite parcialmente hidrogenado. Al comprar alimentos horneados (galletitas dulces y Gaffer) evite el aceite parcialmente  hidrogenado. Los panes y bollos debern ser de granos enteros (harina de maz o de avena entera, en lugar de "harina" o "harina enriquecida"). Compre sopas en lata que no sean cremosas, con bajo contenido de sal y sin grasas adicionadas.  TCNICAS DE PREPARACIN DE LOS ALIMENTOS  Nunca fra los alimentos en aceite abundante. Si debe frer, hgalo en poco aceite y removiendo [place], porque as se utilizan muy pocas grasas, o utilice un spray antiadherente. Cuando le sea posible, hierva, hornee o ase las carnes y cocine los vegetales al vapor. En vez de [employer] con mantequilla o [place], utilice limn y hierbas, pur de Psychologist, educational y canela (para las calabazas y batatas), yogurt y salsa descremados y aderezos para ensaladas bajos en contenido graso.  BAJO EN GRASAS SATURADAS / SUSTITUTOS BAJOS EN GRASA  Carnes / Grasas saturadas (g)  Evite: Bife, corte graso (3 oz/85 g) / 11 g   Elija: Bife, corte magro (3 oz/85 g) / 4 g   Evite: [place] (3 oz/85 g) / 7 g   Elija:  [place] magra (3 oz/85 g) / 5 g   Evite: Jamn (3 oz/85 g) / 6 g   Elija:  Jamn magro (3 oz/85 g) / 2.4 g   Evite: Pollo, con piel (3 oz/85 g), Carne oscura / 4 g   Elija:  Pollo, sin piel (3 oz/85 g), Carne oscura / 2  g   Evite: Pollo, con piel (3 oz/85 g), Carne magra / 2.5 g   Elija: Pollo, sin piel (3 oz/85 g), Carne magra / 1 g  Lcteos / Grasas saturadas (g)  Evite: Leche entera (1 taza) / 5 g   Elija: Leche con bajo contenido de grasa, 2% (1 taza) / 3 g   Elija: Leche con bajo contenido de grasa, 1% (1 taza) / 1.5 g   Elija: Leche descremada (1 taza) / 0.3 g   Evite: Queso duro (1 oz/28 g) / 6 g   Elija: Queso descremado (1 oz/28 g) / 2-3 g   Evite: Queso cottage, 4% grasa (1 taza)/ 6.5 g   Elija: Queso cottage con bajo contenido de grasa, 1% grasa (1 taza)/ 1.5 g   Evite: Helado (1 taza) / 9 g   Elija: Sorbete (1 taza) / 2.5 g   Elija: Yogurt helado sin contenido de grasa  (1 taza) / 0.3 g   Elija: Barras de fruta congeladas / vestigios   Evite: Crema batida (1 cucharada) / 3.5 g   Elija: Batidos glac sin lcteos (1 cucharada) / 1 g  Condimentos / Grasas saturadas (g)  Evite: Mayonesa (1 cucharada) / 2 g   Elija: Mayonesa con bajo contenido de grasa (1 cucharada) / 1 g   Evite: [place] (1 cucharada) / 7 g   Elija: Margarina extra light (1 cucharada) / 1 g   Evite: Aceite de coco (1 cucharada) / 11.8 g   Elija: Aceite de oliva (1 cucharada) / 1.8 g   Elija: Aceite de maz (1 cucharada) / 1.7 g   Elija: Aceite de crtamo (1 cucharada) / 1.2 g   Elija: Aceite de girasol (1 cucharada) / 1.4 g   Elija: Aceite de soja (1 cucharada) / 2.4 g   Elija: Aceite de canola (1 cucharada) / 1 g  Document Released: 05/28/2005 Document Revised: 02/07/2011 [facility] Patient Information 2012 [place], [place].  Ejercicios para perder peso (Exercise to Lose Weight) La actividad fsica y Neomia Dear dieta saludable ayudan a perder peso. El mdico podr sugerirle ejercicios especficos. IDEAS Y CONSEJOS PARA HACER EJERCICIOS  Elija opciones econmicas que disfrute hacer , como caminar, andar en bicicleta o los vdeos para ejercitarse.   Utilice las Microbiologist del ascensor.   Camine durante la hora del almuerzo.   Estacione el auto lejos del lugar de [place] o [place].   Concurra a un gimnasio o tome clases de gimnasia.   Comience con 5  10 minutos de actividad fsica por da. Ejercite hasta 30 minutos, 4 a 6 das por [address]   Utilice zapatos que tengan un buen soporte y ropas cmodas.   Elongue antes y despus de Company secretary.   Ejercite hasta que aumente la respiracin y el corazn palpite rpido.   Beba agua extra cuando ejercite.   No haga ejercicio Firefighter, sentirse mareado o que le falte mucho el aire.  La actividad fsica puede quemar alrededor de 150 caloras.  Correr 20 cuadras en 15 minutos.   Jugar vley durante 45 a 60 minutos.     Limpiar y encerar el auto durante 45 a 60 minutos.   Jugar ftbol americano de toque.   Caminar 25 cuadras en 35 minutos.   Empujar un cochecito 20 cuadras en 30 minutos.   Jugar baloncesto durante 30 minutos.   Rastrillar hojas secas durante 30 minutos.   Andar en bicicleta 80 cuadras en 30 minutos.   Caminar 30 cuadras en  30 minutos.   Bailar durante 30 minutos.   Quitar la nieve con una pala durante 15 minutos.   Nadar vigorosamente durante 20 minutos.   Subir escaleras durante 15 minutos.   Andar en bicicleta 60 cuadras durante 15 minutos.   Arreglar el jardn entre 30 y 45 minutos.   Saltar a la soga durante 15 minutos.   Limpiar vidrios o pisos durante 45 a 60 minutos.  Document Released: 09/01/2010 Document Revised: 02/07/2011 [facility] Patient Information 2012 [place], [place]. Venas varicosas (Varicose Veins) Las venas varicosas son venas que se han agrandado y se tornan sinuosas.  CAUSAS  Este trastorno es el resultado de un malfuncionamiento de las vlvulas de las venas. Las vlvulas de las venas ayudan al retorno de la sangre desde las piernas hacia el corazn. Si estas vlvulas se daan, el flujo sanguneo retorna hacia atrs y [employer] Company venas de la pierna, cerca de la piel. Esto hace que las venas se agranden debido al aumento de la presin en su interior. Es ms probable que desarrollen venas varicosas las personas que estn de pie durante mucho [place], las mujeres embarazadas o quienes tienen sobrepeso. SNTOMAS   Venas hinchadas, tortuosas, y [place], que se observan ms comnmente en las piernas.  Dolor en las piernas o sensacin de pesadez. Estos sntomas pueden empeorar hacia el final del da.  Hinchazn de las piernas.  Cambios en el color de la piel. DIAGNSTICO  Su mdico puede diagnosticarle venas varicosas con un examen de las piernas. Podr recomendarle una ecografa de las venas de sus piernas.  TRATAMIENTO  La mayora de las venas  varicosas pueden tratarse en casa.Sin embargo, existen otros tratamientos para personas con sntomas persistentes o que desean tratar la apariencia de las venas varicosas. Entre estos tratamientos se incluyen:   Corporate treasurer de venas varicosas muy pequeas.  Medicamentos que se inyectan en la vena. Este medicamento endurece las paredes de la vena y Administrator, sports. Se denomina escleroterapia. Luego, deber [employer] ropa o vendajes que apliquen presin.  [place]. INSTRUCCIONES PARA EL CUIDADO DOMICILIARIO  No permanezca sentado o de pie en una posicin durante largos perodos. No se siente con las piernas cruzadas. Descanse con las piernas Conservator, museum/gallery.  Use medias elsticas o de soporte. No use prendas apretadas alrededor de las piernas, pelvis o cintura.  Camine todo lo posible para aumentar el flujo de sangre.  Levante los pies de la cama por la noche, alrededor de 5 cm.  Si se ha cortado la piel que est sobre la vena y [place], recustese con la pierna elevada y presione con un pao limpio hasta que la sangre se [place]. Luego coloque una venda sobre el corte. Consulte a su mdico si contina sangrando o necesita una sutura. SOLICITE ATENCIN MDICA SI:  La piel que rodea el tobillo comienza a lesionarse.  Presenta dolor, enrojecimiento, sensibilidad o hinchazn en la pierna, sobre la vena.  Siente molestias y Radiographer, therapeutic pierna. Document Released: 03/07/2005 Document Revised: 08/20/2011 [facility] Patient Information 2013 [place], [place].

## 2012-04-28 NOTE — Progress Notes (Signed)
Kelly Cruz 11/08/1955 [id number]   History:    56 y.o.  for annual gyn exam who has history of type 2 diabetes and hypertension. She states she's been followed by Dr. Quintella Reichert but has had no blood work drawn at his office. She is fasting today. Review of her record indicated also that in November of 2012 she had a right labia majora melanocytic nevi (benign) removed. Also in 2012 Dr. Dickie La had done a colonoscopy and an adenomatous polyp was noted. Patient also had history vitamin D deficiency and states she is taking her calcium and vitamin D. She was also complaining of left leg painful varicosities. Patient with prior tubal sterilization procedure. Patient denies any prior history of abnormal Pap smears. She is scheduled for mammogram later this month and she does her monthly self breast examination. January this year patient had a bone density study with her lowest T score at the left femoral neck with a value of -1.5 and normal Frax analysis.  Past medical history,surgical history, family history and social history were all reviewed and documented in the EPIC chart.  Gynecologic History Patient's last menstrual period was 04/26/2005. Contraception: tubal ligation Last Pap: 2012. Results were: normal Last mammogram: 2012. Results were: normal  Obstetric History OB History    Grav Para Term Preterm Abortions TAB SAB Ect Mult Living   10 6 5  4     6      # Outc Date GA Lbr Len/2nd Wgt Sex Del Anes PTL Lv   1 TRM     M SVD  No Yes   2 TRM     M SVD  No Yes   3 TRM     M SVD  No Yes   4 TRM     M SVD  No Yes   5 TRM     F SVD  No Yes   6 PAR     M SVD  No Yes   Comments: BABY DIED SAME DAY..   7 ABT            8 ABT            9 ABT            10 ABT                ROS: A ROS was performed and pertinent positives and negatives are included in the history.  GENERAL: No fevers or chills. HEENT: No change in vision, no earache, sore throat or sinus congestion. NECK: No pain or stiffness.  CARDIOVASCULAR: No chest pain or pressure. No palpitations. PULMONARY: No shortness of breath, cough or wheeze. GASTROINTESTINAL: No abdominal pain, nausea, vomiting or diarrhea, melena or bright red blood per rectum. GENITOURINARY: No urinary frequency, urgency, hesitancy or dysuria. MUSCULOSKELETAL: No joint or muscle pain, no back pain, no recent trauma. DERMATOLOGIC: No rash, no itching, no lesions. ENDOCRINE: No polyuria, polydipsia, no heat or cold intolerance. No recent change in weight. HEMATOLOGICAL: No anemia or easy bruising or bleeding. NEUROLOGIC: No headache, seizures, numbness, tingling or weakness. PSYCHIATRIC: No depression, no loss of interest in normal activity or change in sleep pattern.     Exam: chaperone present  BP 132/88  Ht 5' 2.25" (1.581 m)  Wt 151 lb (68.493 kg)  BMI 27.40 kg/m2  LMP 04/26/2005  Body mass index is 27.40 kg/(m^2).  General appearance : Well developed well nourished female. No acute distress HEENT: Neck supple, trachea midline, no carotid bruits,  no thyroidmegaly Lungs: Clear to auscultation, no rhonchi or wheezes, or rib retractions  Heart: Regular rate and rhythm, no murmurs or gallops Breast:Examined in sitting and supine position were symmetrical in appearance, no palpable masses or tenderness,  no skin retraction, no nipple inversion, no nipple discharge, no skin discoloration, no axillary or supraclavicular lymphadenopathy Abdomen: no palpable masses or tenderness, no rebound or guarding Extremities: no edema or skin discoloration or tenderness, left lower extremity varicosities from the thigh down to her calf.  Pelvic:  [place]olin, Urethra, Skene Glands: Within normal limits             Vagina: No gross lesions or discharge  Cervix: No gross lesions or discharge  Uterus  anteverted, normal size, shape and consistency, non-tender and mobile  Adnexa  Without masses or tenderness  Anus and perineum  normal   Rectovaginal  normal sphincter  tone without palpated masses or tenderness             Hemoccult cards provided     Assessment/Plan:  56 y.o. female who has history of hypertension and type 2 diabetes which appears to be diet control. Her primary physician is Dr. Quintella Reichert but she has had no lab work this year and was fasting today. The following labs will be drawn today: Comprehensive metabolic panel, fasting lipid profile, CBC, urinalysis, and vitamin D. New Pap smear screening guidelines discussed she will not need a Pap smear today. She was encouraged to continue to take her calcium and vitamin D for osteoporosis prevention. She'll be referred to the vein specialists for her left leg varicosities which are prominent and causing her discomfort. She'll followup with her mammogram in the next few weeks. Hemoccult cards were provided for testing. Patient received the flu vaccine today.    Ok Edwards MD, 11:08 AM 04/28/2012    In

## 2012-04-29 LAB — VITAMIN D 25 HYDROXY (VIT D DEFICIENCY, FRACTURES): Vit D, 25-Hydroxy: 28 ng/mL — ABNORMAL LOW (ref 30–89)

## 2012-04-29 LAB — URINALYSIS W MICROSCOPIC + REFLEX CULTURE
Bilirubin Urine: NEGATIVE
Crystals: NONE SEEN
Glucose, UA: NEGATIVE mg/dL
Protein, ur: NEGATIVE mg/dL
Specific Gravity, Urine: 1.029 (ref 1.005–1.030)

## 2012-05-06 ENCOUNTER — Encounter: Payer: Self-pay | Admitting: Gynecology

## 2012-05-22 ENCOUNTER — Other Ambulatory Visit: Payer: Self-pay | Admitting: Anesthesiology

## 2012-05-22 DIAGNOSIS — Z1211 Encounter for screening for malignant neoplasm of colon: Secondary | ICD-10-CM

## 2012-05-26 ENCOUNTER — Other Ambulatory Visit: Payer: Self-pay | Admitting: Gynecology

## 2012-05-26 DIAGNOSIS — Z1211 Encounter for screening for malignant neoplasm of colon: Secondary | ICD-10-CM

## 2012-08-17 IMAGING — MG MM DIGITAL SCREENING
4 series · 4 of 4 positions shown · non-contrast
Comparison: none

DG SCREEN MAMMOGRAM BILATERAL
Bilateral CC and MLO view(s) were taken.
Technologist: Moten, They.(TIGER)(M)

DIGITAL SCREENING MAMMOGRAM WITH CAD:
The breast tissue is heterogeneously dense.  No masses or malignant type calcifications are 
identified.  Compared with prior studies [facility]le[facility] Dominic 03-18-05 and 02-05-08.
Images were processed with CAD.

[R CC]
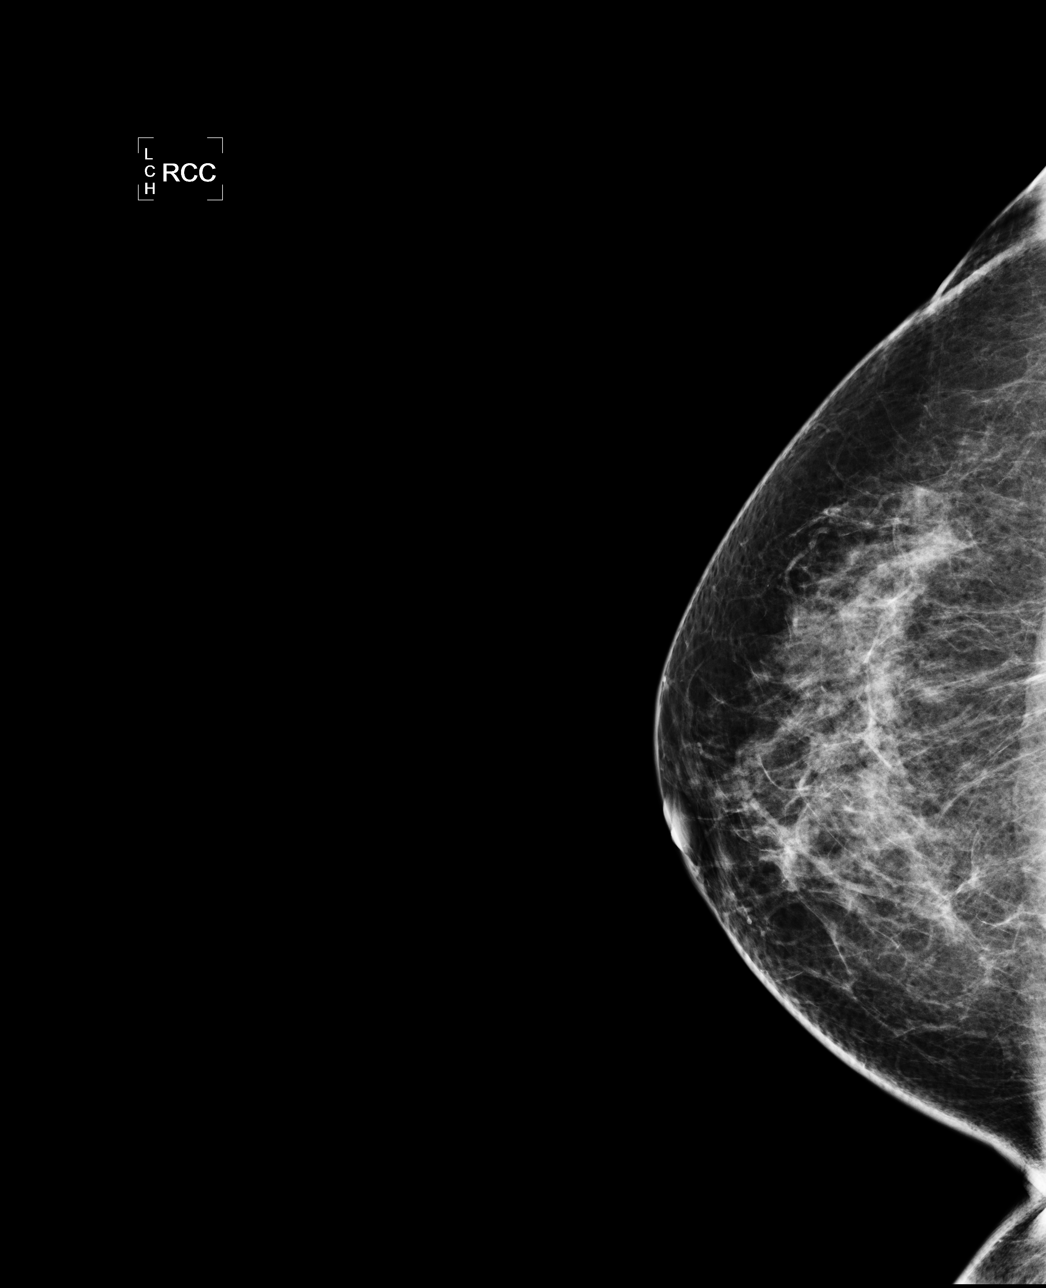

[R MLO]
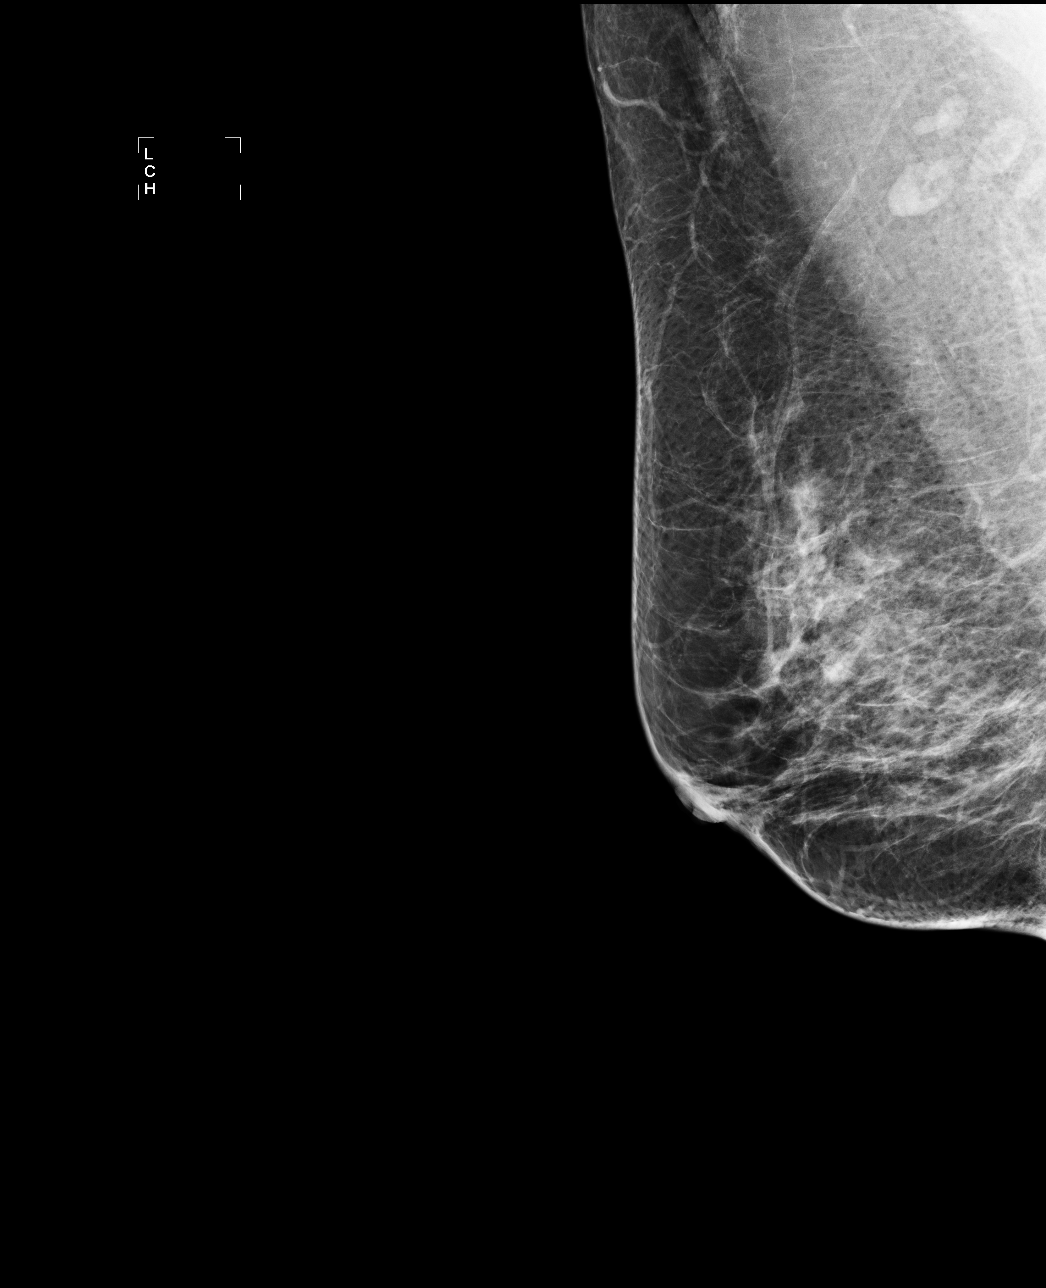

[L CC]
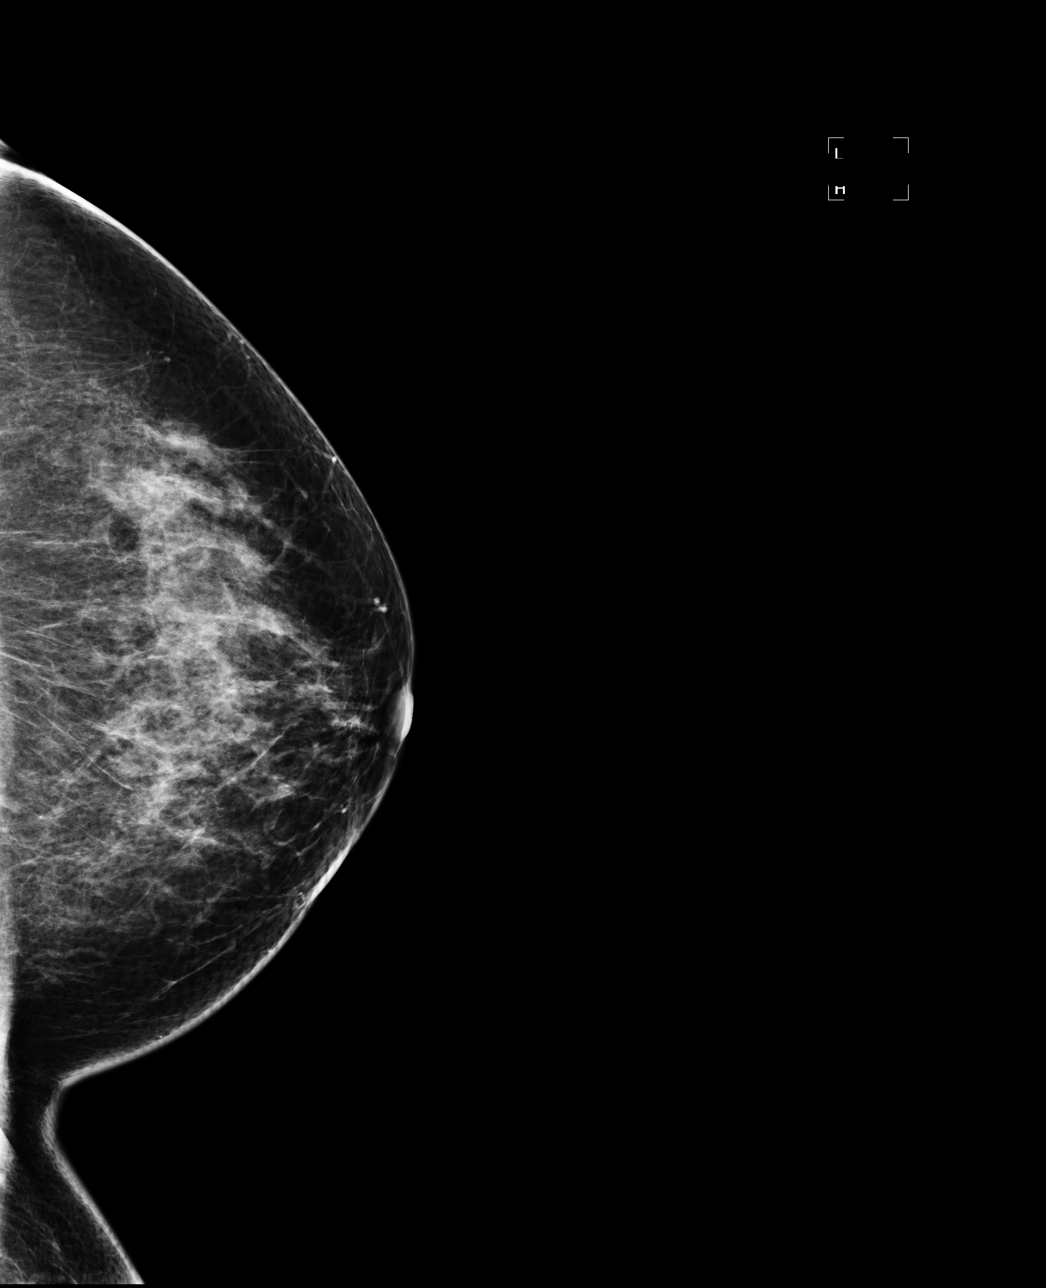

[L MLO]
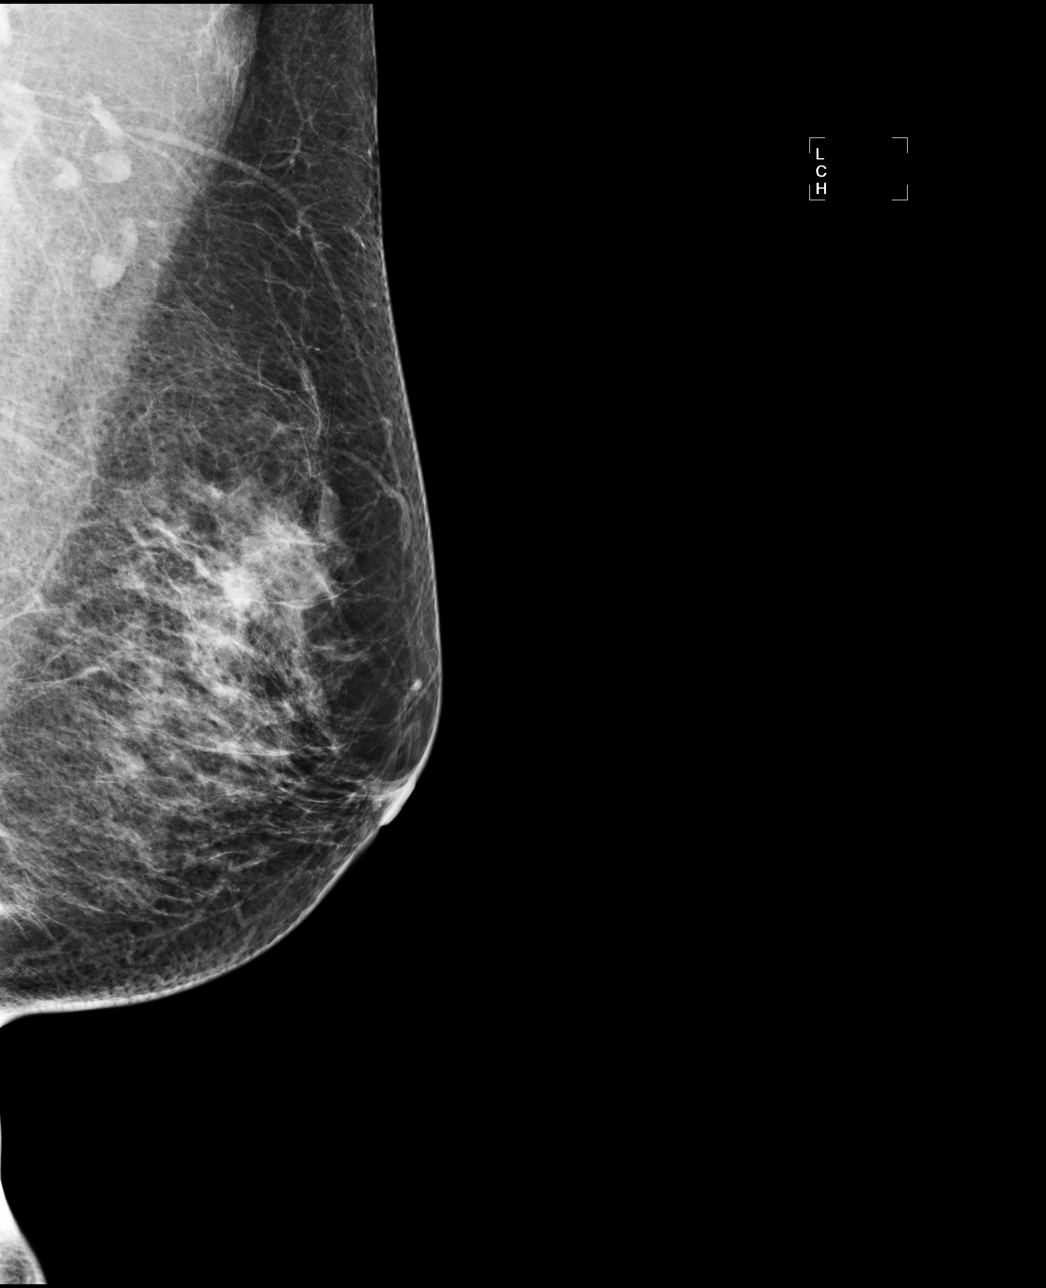

[4 of 4 positions shown; findings below may reference images not displayed]

IMPRESSION: No specific mammographic evidence of malignancy.  Next screening mammogram is recommended in one 
year.

A result letter of this screening mammogram will be mailed directly to the patient.

ASSESSMENT: Negative - BI-RADS 1

Screening mammogram in 1 year.
,

## 2013-11-04 ENCOUNTER — Encounter: Payer: Self-pay | Admitting: Gynecology

## 2013-11-19 ENCOUNTER — Other Ambulatory Visit (HOSPITAL_COMMUNITY)
Admission: RE | Admit: 2013-11-19 | Discharge: 2013-11-19 | Disposition: A | Discharge status: Home / Self Care | Payer: Medicare Other | Source: Ambulatory Visit | Attending: Gynecology | Admitting: Gynecology

## 2013-11-19 ENCOUNTER — Ambulatory Visit (INDEPENDENT_AMBULATORY_CARE_PROVIDER_SITE_OTHER): Payer: Medicare Other | Admitting: Gynecology

## 2013-11-19 ENCOUNTER — Encounter: Payer: Self-pay | Admitting: Gynecology

## 2013-11-19 VITALS — BP 132/80 | Ht 62.0 in | Wt 155.0 lb

## 2013-11-19 DIAGNOSIS — Z23 Encounter for immunization: Secondary | ICD-10-CM

## 2013-11-19 DIAGNOSIS — R635 Abnormal weight gain: Secondary | ICD-10-CM

## 2013-11-19 DIAGNOSIS — Z8679 Personal history of other diseases of the circulatory system: Secondary | ICD-10-CM

## 2013-11-19 DIAGNOSIS — Z124 Encounter for screening for malignant neoplasm of cervix: Secondary | ICD-10-CM

## 2013-11-19 DIAGNOSIS — Z1151 Encounter for screening for human papillomavirus (HPV): Secondary | ICD-10-CM | POA: Insufficient documentation

## 2013-11-19 DIAGNOSIS — IMO0001 Reserved for inherently not codable concepts without codable children: Secondary | ICD-10-CM

## 2013-11-19 DIAGNOSIS — Z01419 Encounter for gynecological examination (general) (routine) without abnormal findings: Secondary | ICD-10-CM

## 2013-11-19 DIAGNOSIS — R6882 Decreased libido: Secondary | ICD-10-CM | POA: Insufficient documentation

## 2013-11-19 DIAGNOSIS — M949 Disorder of cartilage, unspecified: Secondary | ICD-10-CM

## 2013-11-19 DIAGNOSIS — E1165 Type 2 diabetes mellitus with hyperglycemia: Secondary | ICD-10-CM

## 2013-11-19 DIAGNOSIS — M8580 Other specified disorders of bone density and structure, unspecified site: Secondary | ICD-10-CM

## 2013-11-19 DIAGNOSIS — I83893 Varicose veins of bilateral lower extremities with other complications: Secondary | ICD-10-CM | POA: Insufficient documentation

## 2013-11-19 DIAGNOSIS — Z1159 Encounter for screening for other viral diseases: Secondary | ICD-10-CM

## 2013-11-19 DIAGNOSIS — M899 Disorder of bone, unspecified: Secondary | ICD-10-CM

## 2013-11-19 DIAGNOSIS — Z9189 Other specified personal risk factors, not elsewhere classified: Secondary | ICD-10-CM

## 2013-11-19 LAB — CBC WITH DIFFERENTIAL/PLATELET
BASOS ABS: 0 10*3/uL (ref 0.0–0.1)
Basophils Relative: 0 % (ref 0–1)
Eosinophils Absolute: 0 10*3/uL (ref 0.0–0.7)
Eosinophils Relative: 1 % (ref 0–5)
HCT: 38.9 % (ref 36.0–46.0)
Hemoglobin: 13.3 g/dL (ref 12.0–15.0)
LYMPHS PCT: 45 % (ref 12–46)
Lymphs Abs: 2 10*3/uL (ref 0.7–4.0)
MCH: 30.9 pg (ref 26.0–34.0)
MCHC: 34.2 g/dL (ref 30.0–36.0)
MCV: 90.5 fL (ref 78.0–100.0)
Monocytes Absolute: 0.3 10*3/uL (ref 0.1–1.0)
Monocytes Relative: 6 % (ref 3–12)
NEUTROS PCT: 48 % (ref 43–77)
Neutro Abs: 2.2 10*3/uL (ref 1.7–7.7)
PLATELETS: 250 10*3/uL (ref 150–400)
RBC: 4.3 MIL/uL (ref 3.87–5.11)
RDW: 15 % (ref 11.5–15.5)
WBC: 4.5 10*3/uL (ref 4.0–10.5)

## 2013-11-19 MED ORDER — TESTOSTERONE 12.5 MG/ACT (1%) TD GEL: TRANSDERMAL | Status: DC

## 2013-11-19 NOTE — Progress Notes (Signed)
Kelly Cruz March 23, 1956 [id number]   History:    58 y.o.  for annual gyn exam who has not been followed by her primary care physician. Patient with previous diagnosis of type 2 diabetes and hypertension. She is fasting today would like to have her blood work drawn.Review of her record indicated also that in November of 2012 she had a right labia majora melanocytic nevi (benign) removed. Also in 2012 Dr. Maurene Capes had done a colonoscopy and an adenomatous polyp was noted. Patient also had history vitamin D deficiency and states she is taking her calcium and vitamin D. She was also complaining of left leg painful varicosities. Patient with prior tubal sterilization procedure. Patient denies any prior history of abnormal Pap smears. Patient complaining of decreased libido. Patient has not been on any hormone replacement therapy. Patient denies any prior history of abnormal Pap smear. Last bone density study 2013 V. Lois T score was -1.5 at the left femoral neck with normal FRAX.   Past medical history,surgical history, family history and social history were all reviewed and documented in the EPIC chart.  Gynecologic History Patient's last menstrual period was 04/26/2005. Contraception: post menopausal status Last Pap: 2012. Results were: normal Last mammogram: 2015. Results were: normal  Obstetric History OB History  Gravida Para Term Preterm AB SAB TAB Ectopic Multiple Living  10 6 5  4     6     # Outcome Date GA Lbr Len/2nd Weight Sex Delivery Anes PTL Lv  10 ABT           9 ABT           8 ABT           7 ABT           6 PAR     M SVD  N Y     Comments: BABY DIED SAME DAY..  5 TRM     F SVD  N Y  4 TRM     M SVD  N Y  3 TRM     M SVD  N Y  2 TRM     M SVD  N Y  1 TRM     M SVD  N Y       ROS: A ROS was performed and pertinent positives and negatives are included in the history.  GENERAL: No fevers or chills. HEENT: No change in vision, no earache, sore throat or sinus congestion.  NECK: No pain or stiffness. CARDIOVASCULAR: No chest pain or pressure. No palpitations. PULMONARY: No shortness of breath, cough or wheeze. GASTROINTESTINAL: No abdominal pain, nausea, vomiting or diarrhea, melena or bright red blood per rectum. GENITOURINARY: No urinary frequency, urgency, hesitancy or dysuria. MUSCULOSKELETAL: No joint or muscle pain, no back pain, no recent trauma. DERMATOLOGIC: No rash, no itching, no lesions. ENDOCRINE: No polyuria, polydipsia, no heat or cold intolerance. No recent change in weight. HEMATOLOGICAL: No anemia or easy bruising or bleeding. NEUROLOGIC: No headache, seizures, numbness, tingling or weakness. PSYCHIATRIC: No depression, no loss of interest in normal activity or change in sleep pattern.     Exam: chaperone present  BP 132/80  Ht 5\' 2"  (1.575 m)  Wt 155 lb (70.308 kg)  BMI 28.34 kg/m2  LMP 04/26/2005  Body mass index is 28.34 kg/(m^2).  General appearance : Well developed well nourished female. No acute distress HEENT: Neck supple, trachea midline, no carotid bruits, no thyroidmegaly Lungs: Clear to auscultation, no rhonchi or wheezes,  or rib retractions  Heart: Regular rate and rhythm, no murmurs or gallops Breast:Examined in sitting and supine position were symmetrical in appearance, no palpable masses or tenderness,  no skin retraction, no nipple inversion, no nipple discharge, no skin discoloration, no axillary or supraclavicular lymphadenopathy Abdomen: no palpable masses or tenderness, no rebound or guarding Extremities: no edema or skin discoloration or tenderness  Pelvic:  Bartholin, Urethra, Skene Glands: Within normal limits             Vagina: No gross lesions or discharge  Cervix: No gross lesions or discharge  Uterus  anteverted, normal size, shape and consistency, non-tender and mobile  Adnexa  Without masses or tenderness  Anus and perineum  normal   Rectovaginal  normal sphincter tone without palpated masses or tenderness              Hemoccult cards provided     Assessment/Plan:  58 y.o. female for annual exam with history of type 2 diabetes and hypertension on no medication has had poor followup with her PCP on her account. She was reminded to follow up with her PCP. We are going to draw the following clamps today since she is fasting: CBC, fasting lipid profile, TSH, comprehensive metabolic panel, urinalysis as well as Pap smear. She was provided with fecal Hemoccult cards to submit to the office for testing. She was reminded on the importance of monthly breast exam. For decreased libido she will be prescribed testosterone 1% gel to apply periclitoral he 1-twice a week when necessary. The risks benefits and pros and cons were discussed. Information was provided in [place]. It is of her history of osteopenia and past history vitamin D deficiency a vitamin D level will be drawn today.  Note: This dictation was prepared with  Dragon/digital dictation along withSmart phrase technology. Any transcriptional errors that result from this process are unintentional.   Terrance Mass MD, 12:19 PM 11/19/2013

## 2013-11-19 NOTE — Addendum Note (Signed)
Addended by: Thurnell Garbe A on: 11/19/2013 12:30 PM   Modules accepted: Orders

## 2013-11-19 NOTE — Patient Instructions (Signed)
Vacuna difteria, tétanos, tos ferina (DTP) - Lo que debe saber   (Tetanus, Diphtheria, Pertussis [Tdap] Vaccine, What You Need to Know)  ¿PORQUÉ VACUNARSE?   El tétanos, la difteria y la tos ferina pueden ser enfermedades muy graves, aún en adolescentes y adultos. La vacuna Tdap nos puede proteger de estas enfermedades.   El TÉTANOS (Trismo) provoca la contracción dolorosa de los músculos, por lo general, en todo el cuerpo.   · Puede causar el endurecimiento de los músculos de la cabeza y el cuello, de modo que impide abrir la boca, tragar y en algunos casos, respirar. El tétanos causa la muerte de 1 de cada 5 personas que se infectan.  La DIFTERIA produce la formación de una membrana gruesa que cubre el fondo de la garganta.   · Puede causar problemas respiratorios, parálisis, insuficiencia cardíaca e incluso la muerte.  TOS FERINA (Pertusis) causa episodios de tos graves, que pueden hacer difícil la respiración, causar vómitos y trastornos del sueño.   · También puede ser la causa de pérdida de peso, incontinencia y fractura de costillas. Dos de cada 100 adolescentes y cinco de cada 100 adultos que enferman de pertusis deben ser hospitalizados, tienen complicaciones como la neumonía o mueren.  Estas enfermedades son provocadas por bacterias. La difteria y el pertusis se contagian de persona a persona a través de la tos o el estornudo. El tétanos ingresa al organismo a través de cortes, rasguños o heridas.   Antes de las vacunas, en los [place] se vieron más de 200.000 casos al año de difteria y tos ferina y cientos de casos de tétanos. Desde el inicio de la vacunación, los casos de tétanos y difteria han disminuido alrededor del 99% y los casos de tos ferina alrededor del 80%.   Tdap   La vacuna Tdap protege a adolescentes y adultos contra el tétanos, la difteria y la tos ferina. Una dosis de Tdap se administra a los 11 o 12 años de edad. Las personas que no recibieron la vacuna Tdap a esa edad deben  recibirla tan pronto como sea posible.   Es muy importante que los profesionales de la salud y todos aquellos que tengan contacto cercano con bebés menores de 12 meses reciban la Tdap.   Las mujeres embarazadas deben recibir una dosis de Tdap en cada embarazo, para proteger al recién nacido de la tos ferina. Los niños tienen mayor riesgo de complicaciones graves y potencialmente mortales debido a la tos ferina.   Una vacuna similar, llamada Td, protege contra el tétanos y la difteria, pero no contra la tos ferina. Cada 10 años debe recibirse un refuerzo de Td. La Tdap se puede administrar como uno de estos refuerzos, si todavía no ha recibido una dosis. También se puede aplicar después de un corte o quemadura grave para prevenir la infección por tétanos.   El médico le dará más información.   La Tdap puede administrarse de manera segura simultáneamente con otras vacunas.   ALGUNAS PERSONAS NO DEBEN RECIBIR ESTA VACUNA.   · Si alguna vez tuvo una reacción alérgica potencialmente mortal después de una dosis de la vacuna contra el tétanos, la diferia o la tos ferina, o tuvo una alergia grave a cualquiera de los componentes de esta vacuna, no debe aplicarse la vacuna. Informe a su médico si usted sufre algún tipo de alergia grave.  · Si estuvo en coma o sufrió múltiples convulsiones dentro de los 7 días posteriores después de una dosis de DTP o DTaP   no debe recibir la Tdap, salvo que se encuentre otra causa En este caso puede recibir la Td.  · Consulte con su médico si:  · tiene epilepsia u otra enfermedad del sistema nervioso,  · siente dolor intenso o se hincha después de recibir cualquier vacuna contra la difteria, el tétanos o la tos ferina,  · alguna vez ha sufrido el síndrome de Guillain-Barré,  · no se siente bien el día en que se ha programado la vacuna.  RIESGOS DE UNA REACCIÓN A LA VACUNA  Con cualquier medicamento, incluyendo las vacunas, existe la posibilidad de que aparezcan efectos secundarios. Estos son  leves y desaparecen por sí solos, pero también son posibles las reacciones graves.   Breves episodios de desmayo pueden seguir a una vacunación, causando lesiones por la caída. Sentarse o recostarse durante 15 minutos puede ayudar a evitarlo. Informe al médico si se siente mareado o aturdido, tiene cambios en la visión o zumbidos en los oídos.   Problemas leves luego de la Tdap (no interferirán con las actividades)   · Dolor en el sitio de la inyección (alrededor de 1 de cada 4 adolescentes o 2 de cada 3 adultos).  · Enrojecimiento o hinchazón en el lugar de la inyección (1 de cada 5 personas).  · Fiebre leve de al menos 100,4° F (38° C) (hasta alrededor de 1 cada 25 adolescentes y 1 de cada 100 adultos).  · Dolor de cabeza (3 o 4de cada 10 personas).  · Cansancio (1 de cada 3 o 4 personas).  · Náuseas, vómitos, diarrea, dolor de estómago (1 de cada 4 adolescentes o 1 de cada 10 adultos).  · Escalofríos, dolores corporales, dolor articular, erupciones, inflamación de las glándulas (poco frecuente).  Problemas moderados: (interfieren con las actividades, pero no requieren atención médica)   · Dolor en el lugar de la inyección (1 de cada 5 adolescentes o 1 de cada 100 adultos).  · Enrojecimiento o inflamación (1 de cada 16 adolescentes y 1 de cada 25 adultos).  · Fiebre de más de 102°F o 38,9°C (1 de cada 100 adolescentes o 1 de cada 250 adultos).  · Dolor de cabeza (alrededor de 4 de cada 20 adolescentes y 3 de cada 10 adultos).  · Náuseas, vómitos, diarrea, dolor de estómago (1 a 3 de cada 100 personas).  · Hinchazón de todo el brazo en el que se aplicó la vacuna (3 de cada100 personas).  Problemas graves: luego de la Tdap (no puede realizar las actividades habituales, requiere atención médica)   · Inflamación, dolor intenso, sangrado y enrojecimiento en el brazo, en el sitio de la inyección (poco frecuente).  Una reacción alérgica grave puede ocurrir después de la administración de cualquier vacuna (se estima en  menos de 1 en un millón de dosis).   ¿QUÉ PASA SI HAY UNA REACCIÓN GRAVE?   ¿Qué signos debo buscar?  · Observe todo lo que le preocupe, como signos de una reacción alérgica grave, fiebre muy alta o cambios en el comportamiento.  Los signos de una reacción alérgica grave pueden incluir urticaria, hinchazón de la cara y la garganta, dificultad para respirar, ritmo cardíaco acelerado, mareos y debilidad. Estos síntomas pueden comenzar entre unos pocos minutos y algunas horas después de la vacunación.   ¿Qué debo hacer?  · Si usted piensa que se trata de una reacción alérgica grave o de otra emergencia que no puede esperar, llame al 911 o lleve a la persona al hospital más cercano. De lo contrario, llame   a su médico.  · Después, la reacción debe informarse a la "Vaccine Adverse Event Reporting System" (Sistema de información sobre efectos adversos de las vacunas -VAERS). El médico o usted mismo pueden realizar el informe en el sitio web del VAERS www.vaers.hhs.govo llame al 1-800-822-7967.  El VAERS es sólo para informar reacciones. No brindan consejo médico.   PROGRAMA NACIONAL DE COMPENSACIÓN DE DAÑOS POR VACUNAS   El National Vaccine Injury Compensation Program (VICP) es un programa federal que fue creado para compensar a las personas que puedan haber sufrido daños al recibir ciertas vacunas.   Aquellas personas que consideren que han sufrido un daño como consecuencia de una vacuna y quieren saber más acerca del programa y como presentar una denuncia, pueden llamar 1-800-338-2382 o visite el sitio web del VICP en www.hrsa.gov/vaccinecompensation.   ¿CÓMO PUEDO OBTENER MÁS INFORMACIÓN?   · Consulte a su médico.  · Comuníquese con el servicio de salud de su localidad o su estado.  · Comuníquese con los Centros para el control y la prevención de enfermedades (Centers for Disease Control and Prevention , CDC).  · llamando al 1-800-232-4636 o visitando el sitio web del CDC en www.cdc.gov/vaccines.  CDC Tdap Vaccine VIS  (10/18/11)   Document Released: 05/14/2012  ExitCare® Patient Information ©2014 [employer].

## 2013-11-20 LAB — URINALYSIS W MICROSCOPIC + REFLEX CULTURE
BACTERIA UA: NONE SEEN
Bilirubin Urine: NEGATIVE
Casts: NONE SEEN
Crystals: NONE SEEN
Glucose, UA: NEGATIVE mg/dL
HGB URINE DIPSTICK: NEGATIVE
Ketones, ur: NEGATIVE mg/dL
Leukocytes, UA: NEGATIVE
Nitrite: NEGATIVE
Protein, ur: NEGATIVE mg/dL
Specific Gravity, Urine: 1.023 (ref 1.005–1.030)
Squamous Epithelial / LPF: NONE SEEN
UROBILINOGEN UA: 0.2 mg/dL (ref 0.0–1.0)
pH: 7 (ref 5.0–8.0)

## 2013-11-20 LAB — COMPREHENSIVE METABOLIC PANEL
ALT: 15 U/L (ref 0–35)
AST: 20 U/L (ref 0–37)
Albumin: 4.2 g/dL (ref 3.5–5.2)
Alkaline Phosphatase: 68 U/L (ref 39–117)
BILIRUBIN TOTAL: 0.7 mg/dL (ref 0.2–1.2)
BUN: 15 mg/dL (ref 6–23)
CALCIUM: 9.6 mg/dL (ref 8.4–10.5)
CHLORIDE: 101 meq/L (ref 96–112)
CO2: 27 mEq/L (ref 19–32)
Creat: 0.9 mg/dL (ref 0.50–1.10)
Glucose, Bld: 92 mg/dL (ref 70–99)
Potassium: 3.8 mEq/L (ref 3.5–5.3)
Sodium: 139 mEq/L (ref 135–145)
Total Protein: 7.3 g/dL (ref 6.0–8.3)

## 2013-11-20 LAB — LIPID PANEL
CHOL/HDL RATIO: 4.2 ratio
Cholesterol: 208 mg/dL — ABNORMAL HIGH (ref 0–200)
HDL: 50 mg/dL (ref >39)
LDL Cholesterol: 136 mg/dL — ABNORMAL HIGH (ref 0–99)
Triglycerides: 111 mg/dL (ref <150)
VLDL: 22 mg/dL (ref 0–40)

## 2013-11-20 LAB — HEPATITIS C ANTIBODY: HCV AB: NEGATIVE

## 2013-11-20 LAB — VITAMIN D 25 HYDROXY (VIT D DEFICIENCY, FRACTURES): VIT D 25 HYDROXY: 26 ng/mL — AB (ref 30–89)

## 2013-11-20 LAB — TSH: TSH: 3.439 u[IU]/mL (ref 0.350–4.500)

## 2013-11-25 LAB — CYTOLOGY - PAP

## 2014-01-05 ENCOUNTER — Ambulatory Visit (INDEPENDENT_AMBULATORY_CARE_PROVIDER_SITE_OTHER): Payer: Medicare Other

## 2014-01-05 DIAGNOSIS — M899 Disorder of bone, unspecified: Secondary | ICD-10-CM

## 2014-01-05 DIAGNOSIS — M949 Disorder of cartilage, unspecified: Secondary | ICD-10-CM

## 2014-01-05 DIAGNOSIS — M8580 Other specified disorders of bone density and structure, unspecified site: Secondary | ICD-10-CM

## 2014-01-21 DIAGNOSIS — Z1211 Encounter for screening for malignant neoplasm of colon: Secondary | ICD-10-CM

## 2014-01-22 ENCOUNTER — Other Ambulatory Visit: Payer: Medicare Other | Admitting: Anesthesiology

## 2014-01-22 DIAGNOSIS — Z1211 Encounter for screening for malignant neoplasm of colon: Secondary | ICD-10-CM

## 2014-04-12 ENCOUNTER — Encounter: Payer: Self-pay | Admitting: Gynecology

## 2014-10-25 DIAGNOSIS — H9319 Tinnitus, unspecified ear: Secondary | ICD-10-CM | POA: Diagnosis not present

## 2014-10-25 DIAGNOSIS — G43909 Migraine, unspecified, not intractable, without status migrainosus: Secondary | ICD-10-CM | POA: Diagnosis not present

## 2014-10-25 DIAGNOSIS — I10 Essential (primary) hypertension: Secondary | ICD-10-CM | POA: Diagnosis not present

## 2014-10-25 DIAGNOSIS — E559 Vitamin D deficiency, unspecified: Secondary | ICD-10-CM | POA: Diagnosis not present

## 2014-10-25 DIAGNOSIS — R5383 Other fatigue: Secondary | ICD-10-CM | POA: Diagnosis not present

## 2014-10-25 DIAGNOSIS — F329 Major depressive disorder, single episode, unspecified: Secondary | ICD-10-CM | POA: Diagnosis not present

## 2014-11-15 DIAGNOSIS — I10 Essential (primary) hypertension: Secondary | ICD-10-CM | POA: Diagnosis not present

## 2014-11-15 DIAGNOSIS — G43909 Migraine, unspecified, not intractable, without status migrainosus: Secondary | ICD-10-CM | POA: Diagnosis not present

## 2014-11-15 DIAGNOSIS — F329 Major depressive disorder, single episode, unspecified: Secondary | ICD-10-CM | POA: Diagnosis not present

## 2015-03-03 DIAGNOSIS — Z1231 Encounter for screening mammogram for malignant neoplasm of breast: Secondary | ICD-10-CM | POA: Diagnosis not present

## 2015-03-08 ENCOUNTER — Encounter: Payer: Self-pay | Admitting: Gynecology

## 2015-03-22 DIAGNOSIS — G43839 Menstrual migraine, intractable, without status migrainosus: Secondary | ICD-10-CM | POA: Diagnosis not present

## 2015-03-22 DIAGNOSIS — R51 Headache: Secondary | ICD-10-CM | POA: Diagnosis not present

## 2015-03-22 DIAGNOSIS — Z049 Encounter for examination and observation for unspecified reason: Secondary | ICD-10-CM | POA: Diagnosis not present

## 2015-03-22 DIAGNOSIS — G43719 Chronic migraine without aura, intractable, without status migrainosus: Secondary | ICD-10-CM | POA: Diagnosis not present

## 2015-03-22 DIAGNOSIS — Z79899 Other long term (current) drug therapy: Secondary | ICD-10-CM | POA: Diagnosis not present

## 2015-03-23 DIAGNOSIS — G518 Other disorders of facial nerve: Secondary | ICD-10-CM | POA: Diagnosis not present

## 2015-03-23 DIAGNOSIS — G43839 Menstrual migraine, intractable, without status migrainosus: Secondary | ICD-10-CM | POA: Diagnosis not present

## 2015-03-23 DIAGNOSIS — M791 Myalgia: Secondary | ICD-10-CM | POA: Diagnosis not present

## 2015-03-23 DIAGNOSIS — R51 Headache: Secondary | ICD-10-CM | POA: Diagnosis not present

## 2015-03-23 DIAGNOSIS — M542 Cervicalgia: Secondary | ICD-10-CM | POA: Diagnosis not present

## 2015-03-23 DIAGNOSIS — G43719 Chronic migraine without aura, intractable, without status migrainosus: Secondary | ICD-10-CM | POA: Diagnosis not present

## 2015-03-24 DIAGNOSIS — H538 Other visual disturbances: Secondary | ICD-10-CM | POA: Diagnosis not present

## 2015-03-24 DIAGNOSIS — H2511 Age-related nuclear cataract, right eye: Secondary | ICD-10-CM | POA: Diagnosis not present

## 2015-03-24 DIAGNOSIS — H40003 Preglaucoma, unspecified, bilateral: Secondary | ICD-10-CM | POA: Diagnosis not present

## 2015-03-28 ENCOUNTER — Encounter: Payer: Self-pay | Admitting: Gynecology

## 2015-03-28 ENCOUNTER — Ambulatory Visit (INDEPENDENT_AMBULATORY_CARE_PROVIDER_SITE_OTHER): Payer: Medicare Other | Admitting: Gynecology

## 2015-03-28 VITALS — BP 130/80 | Ht 62.0 in | Wt 157.0 lb

## 2015-03-28 DIAGNOSIS — R51 Headache: Secondary | ICD-10-CM | POA: Diagnosis not present

## 2015-03-28 DIAGNOSIS — Z780 Asymptomatic menopausal state: Secondary | ICD-10-CM | POA: Diagnosis not present

## 2015-03-28 DIAGNOSIS — Z01419 Encounter for gynecological examination (general) (routine) without abnormal findings: Secondary | ICD-10-CM

## 2015-03-28 DIAGNOSIS — M859 Disorder of bone density and structure, unspecified: Secondary | ICD-10-CM | POA: Diagnosis not present

## 2015-03-28 DIAGNOSIS — N952 Postmenopausal atrophic vaginitis: Secondary | ICD-10-CM

## 2015-03-28 DIAGNOSIS — Z8639 Personal history of other endocrine, nutritional and metabolic disease: Secondary | ICD-10-CM | POA: Diagnosis not present

## 2015-03-28 DIAGNOSIS — F329 Major depressive disorder, single episode, unspecified: Secondary | ICD-10-CM | POA: Diagnosis not present

## 2015-03-28 DIAGNOSIS — M8580 Other specified disorders of bone density and structure, unspecified site: Secondary | ICD-10-CM

## 2015-03-28 NOTE — Progress Notes (Signed)
Kelly Cruz 1956/05/06 [id number]   History:    59 y.o.  for annual gyn exam with no particular issues. She has informed me that she is being followed by the neurologist because of her headaches and he recently did a panel blood work consisting of lipid profile and blood sugar so no blood test is needed today. Her other primary doctor has detected a she has vitamin D deficiency and she is currently taking vitamin D 50,000 units every weekly. She has one more pill left for next week. Her last bone density study was done in 2015 and her lowest T score was at the left femoral neck with a value of -1.1 and a normal Frax analysis. She is on no hormone replacement therapy. Not sexually active. Patient has always had normal Pap smears. Her colonoscopy in 2012 demonstrating adenomatous polyps which were benign.   Past medical history,surgical history, family history and social history were all reviewed and documented in the EPIC chart.  Gynecologic History Patient's last menstrual period was 04/26/2005. Contraception: post menopausal status Last Pap: 2015. Results were: normal Last mammogram: 2016. Results were: Normal three-dimensional mammogram dense breasts  Obstetric History OB History  Gravida Para Term Preterm AB SAB TAB Ectopic Multiple Living  10 6 5  4     6     # Outcome Date GA Lbr Len/2nd Weight Sex Delivery Anes PTL Lv  10 AB           9 AB           8 AB           7 AB           6 Para     M Vag-Spont  N Y     Comments: BABY DIED SAME DAY..  5 Term     F Vag-Spont  N Y  4 Term     M Vag-Spont  N Y  3 Term     M Vag-Spont  N Y  2 Term     M Vag-Spont  N Y  1 Term     M Vag-Spont  N Y       ROS: A ROS was performed and pertinent positives and negatives are included in the history.  GENERAL: No fevers or chills. HEENT: No change in vision, no earache, sore throat or sinus congestion. NECK: No pain or stiffness. CARDIOVASCULAR: No chest pain or pressure. No palpitations.  PULMONARY: No shortness of breath, cough or wheeze. GASTROINTESTINAL: No abdominal pain, nausea, vomiting or diarrhea, melena or bright red blood per rectum. GENITOURINARY: No urinary frequency, urgency, hesitancy or dysuria. MUSCULOSKELETAL: No joint or muscle pain, no back pain, no recent trauma. DERMATOLOGIC: No rash, no itching, no lesions. ENDOCRINE: No polyuria, polydipsia, no heat or cold intolerance. No recent change in weight. HEMATOLOGICAL: No anemia or easy bruising or bleeding. NEUROLOGIC: No headache, seizures, numbness, tingling or weakness. PSYCHIATRIC: No depression, no loss of interest in normal activity or change in sleep pattern.     Exam: chaperone present  BP 130/80 mmHg  Ht 5\' 2"  (1.575 m)  Wt 157 lb (71.215 kg)  BMI 28.71 kg/m2  LMP 04/26/2005  Body mass index is 28.71 kg/(m^2).  General appearance : Well developed well nourished female. No acute distress HEENT: Eyes: no retinal hemorrhage or exudates,  Neck supple, trachea midline, no carotid bruits, no thyroidmegaly Lungs: Clear to auscultation, no rhonchi or wheezes, or rib retractions  Heart: Regular rate  and rhythm, no murmurs or gallops Breast:Examined in sitting and supine position were symmetrical in appearance, no palpable masses or tenderness,  no skin retraction, no nipple inversion, no nipple discharge, no skin discoloration, no axillary or supraclavicular lymphadenopathy Abdomen: no palpable masses or tenderness, no rebound or guarding Extremities: no edema or skin discoloration or tenderness  Pelvic:  Bartholin, Urethra, Skene Glands: Within normal limits             Vagina: No gross lesions or discharge, Atrophic changes  Cervix: No gross lesions or discharge  Uterus  anteverted, normal size, shape and consistency, non-tender and mobile  Adnexa  Without masses or tenderness  Anus and perineum  normal   Rectovaginal  normal sphincter tone without palpated masses or tenderness             Hemoccult  cards will be provided     Assessment/Plan:  59 y.o. female for annual exam who is menopausal on no hormone replacement therapy. Patient with history vitamin D deficiency will check her vitamin D level today. Her primary doctor has been doing her blood work. She was reminded the next year she will need a colonoscopy because of her history of benign adenomatous polyps removed at time of colonoscopy in 2012. She was reminded of the monthly breast exams. She declined the flu vaccine today. She will begin taking vitamin D3 2000 units daily after she finishes her last dose of the 50,000 units every weekly next week.  Terrance Mass MD, 12:33 PM 03/28/2015

## 2015-03-29 LAB — VITAMIN D 25 HYDROXY (VIT D DEFICIENCY, FRACTURES): Vit D, 25-Hydroxy: 26 ng/mL — ABNORMAL LOW (ref 30–100)

## 2015-03-30 DIAGNOSIS — F321 Major depressive disorder, single episode, moderate: Secondary | ICD-10-CM | POA: Diagnosis not present

## 2015-03-30 DIAGNOSIS — F333 Major depressive disorder, recurrent, severe with psychotic symptoms: Secondary | ICD-10-CM | POA: Diagnosis not present

## 2015-03-31 DIAGNOSIS — F321 Major depressive disorder, single episode, moderate: Secondary | ICD-10-CM | POA: Diagnosis not present

## 2015-03-31 DIAGNOSIS — F333 Major depressive disorder, recurrent, severe with psychotic symptoms: Secondary | ICD-10-CM | POA: Diagnosis not present

## 2015-04-04 ENCOUNTER — Other Ambulatory Visit: Payer: Self-pay | Admitting: Gynecology

## 2015-04-04 ENCOUNTER — Other Ambulatory Visit: Payer: Self-pay | Admitting: Anesthesiology

## 2015-04-04 DIAGNOSIS — H40003 Preglaucoma, unspecified, bilateral: Secondary | ICD-10-CM | POA: Diagnosis not present

## 2015-04-04 DIAGNOSIS — E559 Vitamin D deficiency, unspecified: Secondary | ICD-10-CM

## 2015-04-04 MED ORDER — VITAMIN D (ERGOCALCIFEROL) 1.25 MG (50000 UNIT) PO CAPS: 50000.0000 [IU] | ORAL_CAPSULE | ORAL | Status: DC

## 2015-04-05 DIAGNOSIS — H9312 Tinnitus, left ear: Secondary | ICD-10-CM | POA: Diagnosis not present

## 2015-04-05 DIAGNOSIS — H903 Sensorineural hearing loss, bilateral: Secondary | ICD-10-CM | POA: Diagnosis not present

## 2015-04-06 DIAGNOSIS — M791 Myalgia: Secondary | ICD-10-CM | POA: Diagnosis not present

## 2015-04-06 DIAGNOSIS — G518 Other disorders of facial nerve: Secondary | ICD-10-CM | POA: Diagnosis not present

## 2015-04-06 DIAGNOSIS — R51 Headache: Secondary | ICD-10-CM | POA: Diagnosis not present

## 2015-04-06 DIAGNOSIS — G43719 Chronic migraine without aura, intractable, without status migrainosus: Secondary | ICD-10-CM | POA: Diagnosis not present

## 2015-04-06 DIAGNOSIS — G43839 Menstrual migraine, intractable, without status migrainosus: Secondary | ICD-10-CM | POA: Diagnosis not present

## 2015-04-06 DIAGNOSIS — M542 Cervicalgia: Secondary | ICD-10-CM | POA: Diagnosis not present

## 2015-04-07 DIAGNOSIS — H538 Other visual disturbances: Secondary | ICD-10-CM | POA: Diagnosis not present

## 2015-04-07 DIAGNOSIS — H40003 Preglaucoma, unspecified, bilateral: Secondary | ICD-10-CM | POA: Diagnosis not present

## 2015-04-07 DIAGNOSIS — H2511 Age-related nuclear cataract, right eye: Secondary | ICD-10-CM | POA: Diagnosis not present

## 2015-04-26 DIAGNOSIS — G518 Other disorders of facial nerve: Secondary | ICD-10-CM | POA: Diagnosis not present

## 2015-04-26 DIAGNOSIS — M542 Cervicalgia: Secondary | ICD-10-CM | POA: Diagnosis not present

## 2015-04-26 DIAGNOSIS — G43719 Chronic migraine without aura, intractable, without status migrainosus: Secondary | ICD-10-CM | POA: Diagnosis not present

## 2015-04-26 DIAGNOSIS — R51 Headache: Secondary | ICD-10-CM | POA: Diagnosis not present

## 2015-04-26 DIAGNOSIS — G43839 Menstrual migraine, intractable, without status migrainosus: Secondary | ICD-10-CM | POA: Diagnosis not present

## 2015-04-26 DIAGNOSIS — M791 Myalgia: Secondary | ICD-10-CM | POA: Diagnosis not present

## 2015-04-29 ENCOUNTER — Other Ambulatory Visit: Payer: Medicare Other | Admitting: Anesthesiology

## 2015-04-29 DIAGNOSIS — Z1211 Encounter for screening for malignant neoplasm of colon: Secondary | ICD-10-CM

## 2015-05-09 DIAGNOSIS — Z0289 Encounter for other administrative examinations: Secondary | ICD-10-CM

## 2015-05-10 DIAGNOSIS — G43719 Chronic migraine without aura, intractable, without status migrainosus: Secondary | ICD-10-CM | POA: Diagnosis not present

## 2015-05-10 DIAGNOSIS — G43839 Menstrual migraine, intractable, without status migrainosus: Secondary | ICD-10-CM | POA: Diagnosis not present

## 2015-05-10 DIAGNOSIS — G518 Other disorders of facial nerve: Secondary | ICD-10-CM | POA: Diagnosis not present

## 2015-05-10 DIAGNOSIS — M542 Cervicalgia: Secondary | ICD-10-CM | POA: Diagnosis not present

## 2015-05-10 DIAGNOSIS — R51 Headache: Secondary | ICD-10-CM | POA: Diagnosis not present

## 2015-05-10 DIAGNOSIS — M791 Myalgia: Secondary | ICD-10-CM | POA: Diagnosis not present

## 2015-05-12 DIAGNOSIS — F321 Major depressive disorder, single episode, moderate: Secondary | ICD-10-CM | POA: Diagnosis not present

## 2015-05-12 DIAGNOSIS — F333 Major depressive disorder, recurrent, severe with psychotic symptoms: Secondary | ICD-10-CM | POA: Diagnosis not present

## 2015-05-13 DIAGNOSIS — F333 Major depressive disorder, recurrent, severe with psychotic symptoms: Secondary | ICD-10-CM | POA: Diagnosis not present

## 2015-05-13 DIAGNOSIS — F321 Major depressive disorder, single episode, moderate: Secondary | ICD-10-CM | POA: Diagnosis not present

## 2015-05-16 DIAGNOSIS — F333 Major depressive disorder, recurrent, severe with psychotic symptoms: Secondary | ICD-10-CM | POA: Diagnosis not present

## 2015-05-24 DIAGNOSIS — M791 Myalgia: Secondary | ICD-10-CM | POA: Diagnosis not present

## 2015-05-24 DIAGNOSIS — G43839 Menstrual migraine, intractable, without status migrainosus: Secondary | ICD-10-CM | POA: Diagnosis not present

## 2015-05-24 DIAGNOSIS — M542 Cervicalgia: Secondary | ICD-10-CM | POA: Diagnosis not present

## 2015-05-24 DIAGNOSIS — G518 Other disorders of facial nerve: Secondary | ICD-10-CM | POA: Diagnosis not present

## 2015-05-24 DIAGNOSIS — R51 Headache: Secondary | ICD-10-CM | POA: Diagnosis not present

## 2015-05-24 DIAGNOSIS — G43719 Chronic migraine without aura, intractable, without status migrainosus: Secondary | ICD-10-CM | POA: Diagnosis not present

## 2015-05-30 DIAGNOSIS — F329 Major depressive disorder, single episode, unspecified: Secondary | ICD-10-CM | POA: Diagnosis not present

## 2015-05-30 DIAGNOSIS — I10 Essential (primary) hypertension: Secondary | ICD-10-CM | POA: Diagnosis not present

## 2015-05-30 DIAGNOSIS — G43909 Migraine, unspecified, not intractable, without status migrainosus: Secondary | ICD-10-CM | POA: Diagnosis not present

## 2015-06-10 DIAGNOSIS — M791 Myalgia: Secondary | ICD-10-CM | POA: Diagnosis not present

## 2015-06-10 DIAGNOSIS — G518 Other disorders of facial nerve: Secondary | ICD-10-CM | POA: Diagnosis not present

## 2015-06-10 DIAGNOSIS — G43719 Chronic migraine without aura, intractable, without status migrainosus: Secondary | ICD-10-CM | POA: Diagnosis not present

## 2015-06-10 DIAGNOSIS — G43839 Menstrual migraine, intractable, without status migrainosus: Secondary | ICD-10-CM | POA: Diagnosis not present

## 2015-06-10 DIAGNOSIS — R51 Headache: Secondary | ICD-10-CM | POA: Diagnosis not present

## 2015-06-10 DIAGNOSIS — M542 Cervicalgia: Secondary | ICD-10-CM | POA: Diagnosis not present

## 2015-06-13 ENCOUNTER — Other Ambulatory Visit: Payer: Self-pay | Admitting: Gynecology

## 2015-06-17 DIAGNOSIS — F321 Major depressive disorder, single episode, moderate: Secondary | ICD-10-CM | POA: Diagnosis not present

## 2015-06-17 DIAGNOSIS — F333 Major depressive disorder, recurrent, severe with psychotic symptoms: Secondary | ICD-10-CM | POA: Diagnosis not present

## 2015-06-30 DIAGNOSIS — F333 Major depressive disorder, recurrent, severe with psychotic symptoms: Secondary | ICD-10-CM | POA: Diagnosis not present

## 2015-06-30 DIAGNOSIS — F321 Major depressive disorder, single episode, moderate: Secondary | ICD-10-CM | POA: Diagnosis not present

## 2015-07-05 DIAGNOSIS — F321 Major depressive disorder, single episode, moderate: Secondary | ICD-10-CM | POA: Diagnosis not present

## 2015-07-08 ENCOUNTER — Other Ambulatory Visit: Payer: Medicare Other

## 2015-07-08 DIAGNOSIS — E559 Vitamin D deficiency, unspecified: Secondary | ICD-10-CM

## 2015-07-10 LAB — VITAMIN D 25 HYDROXY (VIT D DEFICIENCY, FRACTURES): Vit D, 25-Hydroxy: 30 ng/mL (ref 30–100)

## 2015-07-13 ENCOUNTER — Other Ambulatory Visit: Payer: Self-pay | Admitting: Gynecology

## 2015-07-13 MED ORDER — VITAMIN D (ERGOCALCIFEROL) 1.25 MG (50000 UNIT) PO CAPS: ORAL_CAPSULE | ORAL | Status: AC

## 2015-10-20 DIAGNOSIS — H2511 Age-related nuclear cataract, right eye: Secondary | ICD-10-CM | POA: Diagnosis not present

## 2015-10-20 DIAGNOSIS — H40003 Preglaucoma, unspecified, bilateral: Secondary | ICD-10-CM | POA: Diagnosis not present

## 2015-10-20 DIAGNOSIS — H11151 Pinguecula, right eye: Secondary | ICD-10-CM | POA: Diagnosis not present

## 2015-10-24 DIAGNOSIS — G43719 Chronic migraine without aura, intractable, without status migrainosus: Secondary | ICD-10-CM | POA: Diagnosis not present

## 2015-10-24 DIAGNOSIS — G43839 Menstrual migraine, intractable, without status migrainosus: Secondary | ICD-10-CM | POA: Diagnosis not present

## 2015-11-04 DIAGNOSIS — F329 Major depressive disorder, single episode, unspecified: Secondary | ICD-10-CM | POA: Diagnosis not present

## 2015-11-04 DIAGNOSIS — R8290 Unspecified abnormal findings in urine: Secondary | ICD-10-CM | POA: Diagnosis not present

## 2015-11-04 DIAGNOSIS — I10 Essential (primary) hypertension: Secondary | ICD-10-CM | POA: Diagnosis not present

## 2015-11-04 DIAGNOSIS — Z1159 Encounter for screening for other viral diseases: Secondary | ICD-10-CM | POA: Diagnosis not present

## 2015-11-04 DIAGNOSIS — R000 Tachycardia, unspecified: Secondary | ICD-10-CM | POA: Diagnosis not present

## 2015-11-04 DIAGNOSIS — R5383 Other fatigue: Secondary | ICD-10-CM | POA: Diagnosis not present

## 2015-12-28 DIAGNOSIS — B028 Zoster with other complications: Secondary | ICD-10-CM | POA: Diagnosis not present

## 2015-12-30 DIAGNOSIS — F321 Major depressive disorder, single episode, moderate: Secondary | ICD-10-CM | POA: Diagnosis not present

## 2016-01-13 DIAGNOSIS — B0229 Other postherpetic nervous system involvement: Secondary | ICD-10-CM | POA: Diagnosis not present

## 2016-01-27 DIAGNOSIS — G43839 Menstrual migraine, intractable, without status migrainosus: Secondary | ICD-10-CM | POA: Diagnosis not present

## 2016-01-27 DIAGNOSIS — R51 Headache: Secondary | ICD-10-CM | POA: Diagnosis not present

## 2016-01-27 DIAGNOSIS — M791 Myalgia: Secondary | ICD-10-CM | POA: Diagnosis not present

## 2016-01-27 DIAGNOSIS — M542 Cervicalgia: Secondary | ICD-10-CM | POA: Diagnosis not present

## 2016-01-27 DIAGNOSIS — G43719 Chronic migraine without aura, intractable, without status migrainosus: Secondary | ICD-10-CM | POA: Diagnosis not present

## 2016-01-27 DIAGNOSIS — G518 Other disorders of facial nerve: Secondary | ICD-10-CM | POA: Diagnosis not present

## 2016-02-16 DIAGNOSIS — F321 Major depressive disorder, single episode, moderate: Secondary | ICD-10-CM | POA: Diagnosis not present

## 2016-03-05 ENCOUNTER — Encounter: Payer: Self-pay | Admitting: Gynecology

## 2016-03-05 DIAGNOSIS — Z1231 Encounter for screening mammogram for malignant neoplasm of breast: Secondary | ICD-10-CM | POA: Diagnosis not present

## 2016-03-19 ENCOUNTER — Encounter: Payer: Self-pay | Admitting: Gynecology

## 2016-03-19 ENCOUNTER — Ambulatory Visit (INDEPENDENT_AMBULATORY_CARE_PROVIDER_SITE_OTHER): Payer: Medicare Other | Admitting: Gynecology

## 2016-03-19 VITALS — BP 128/86 | Ht 62.0 in | Wt 156.0 lb

## 2016-03-19 DIAGNOSIS — Z01419 Encounter for gynecological examination (general) (routine) without abnormal findings: Secondary | ICD-10-CM | POA: Diagnosis not present

## 2016-03-19 DIAGNOSIS — N9089 Other specified noninflammatory disorders of vulva and perineum: Secondary | ICD-10-CM | POA: Diagnosis not present

## 2016-03-19 DIAGNOSIS — M8580 Other specified disorders of bone density and structure, unspecified site: Secondary | ICD-10-CM | POA: Diagnosis not present

## 2016-03-19 DIAGNOSIS — Z8639 Personal history of other endocrine, nutritional and metabolic disease: Secondary | ICD-10-CM

## 2016-03-19 NOTE — Progress Notes (Signed)
Kelly Cruz 1955/07/27 [id number]   History:    60 y.o.  for annual gyn exam with mild irritation of the external genitalia. She is being followed by her neurologist because of her headaches . Her primary physician is T her in the past for vitamin D deficiency. She had a normal vitamin D level this January. She states she's taking calcium with vitamin D on a regular basis.Her last bone density study was done in 2015 and her lowest T score was at the left femoral neck with a value of -1.1 and a normal Frax analysis. She is on no hormone replacement therapy. Not sexually active. Patient has always had normal Pap smears. Her colonoscopy in 2012 demonstrating adenomatous polyps which were benign. Patient on no hormone replacement therapy.   Past medical history,surgical history, family history and social history were all reviewed and documented in the EPIC chart.  Gynecologic History Patient's last menstrual period was 04/26/2005. Contraception: post menopausal status Last Pap: 2015. Results were: normal Last mammogram: 2017. Results were: normal  Obstetric History OB History  Gravida Para Term Preterm AB Living  10 6 5   4 6   SAB TAB Ectopic Multiple Live Births          6    # Outcome Date GA Lbr Len/2nd Weight Sex Delivery Anes PTL Lv  10 AB           9 AB           8 AB           7 AB           6 Para     M Vag-Spont  N LIV     Birth Comments: BABY DIED SAME DAY..  5 Term     F Vag-Spont  N LIV  4 Term     M Vag-Spont  N LIV  3 Term     M Vag-Spont  N LIV  2 Term     M Vag-Spont  N LIV  1 Term     M Vag-Spont  N LIV       ROS: A ROS was performed and pertinent positives and negatives are included in the history.  GENERAL: No fevers or chills. HEENT: No change in vision, no earache, sore throat or sinus congestion. NECK: No pain or stiffness. CARDIOVASCULAR: No chest pain or pressure. No palpitations. PULMONARY: No shortness of breath, cough or wheeze. GASTROINTESTINAL: No  abdominal pain, nausea, vomiting or diarrhea, melena or bright red blood per rectum. GENITOURINARY: No urinary frequency, urgency, hesitancy or dysuria. MUSCULOSKELETAL: No joint or muscle pain, no back pain, no recent trauma. DERMATOLOGIC: No rash, no itching, no lesions. ENDOCRINE: No polyuria, polydipsia, no heat or cold intolerance. No recent change in weight. HEMATOLOGICAL: No anemia or easy bruising or bleeding. NEUROLOGIC: No headache, seizures, numbness, tingling or weakness. PSYCHIATRIC: No depression, no loss of interest in normal activity or change in sleep pattern.     Exam: chaperone present  BP 128/86   Ht 5\' 2"  (1.575 m)   Wt 156 lb (70.8 kg)   LMP 04/26/2005   BMI 28.53 kg/m   Body mass index is 28.53 kg/m.  General appearance : Well developed well nourished female. No acute distress HEENT: Eyes: no retinal hemorrhage or exudates,  Neck supple, trachea midline, no carotid bruits, no thyroidmegaly Lungs: Clear to auscultation, no rhonchi or wheezes, or rib retractions  Heart: Regular rate and rhythm, no murmurs or  gallops Breast:Examined in sitting and supine position were symmetrical in appearance, no palpable masses or tenderness,  no skin retraction, no nipple inversion, no nipple discharge, no skin discoloration, no axillary or supraclavicular lymphadenopathy Abdomen: no palpable masses or tenderness, no rebound or guarding Extremities: no edema or skin discoloration or tenderness  Pelvic:  Vulvar intertrigo  Bartholin, Urethra, Skene Glands: Within normal limits             Vagina: No gross lesions or discharge  Cervix: No gross lesions or discharge  Uterus  anteverted, normal size, shape and consistency, non-tender and mobile  Adnexa  Without masses or tenderness  Anus and perineum  normal   Rectovaginal  normal sphincter tone without palpated masses or tenderness             Hemoccult PCP provides     Assessment/Plan:  60 y.o. female for annual exam with  history of osteopenia will be scheduled to undergo bone density study here in the office next few weeks. I have reminded her to contact her gastroenterologist since 5 years ago she had a polyp removed. Pap smear not indicated this year. We discussed importance of calcium vitamin D and weightbearing exercises for osteoporosis prevention. Patient declined flu vaccine today. Patient not indicated this year. For her vulvar intertrigo she is going to be prescribed year mytrex cream to apply twice a day for 7 days but had too expensive over-the-counter Monistat.   Terrance Mass MD, 12:14 PM 03/19/2016

## 2016-03-19 NOTE — Patient Instructions (Signed)
Densitometra sea (Bone Densitometry) La densitometra sea es un estudio de diagnstico por imgenes en el que se utiliza una radiografa especial que mide la cantidad de calcio y otros minerales en los huesos (densidad sea). Este estudio tambin se conoce como examen de densidad mineral sea o radioabsorciometra de doble energa (DEXA). Puede medir la densidad sea en la cadera y la columna. Es similar a Education administrator. Tambin pueden hacerle este estudio para:  Diagnosticar una enfermedad que causa huesos dbiles o delgados (osteoporosis).  Predecir el riesgo de un hueso roto (fractura).  Determinar si el tratamiento para la osteoporosis funciona. INFORME A SU MDICO:  Cualquier alergia que tenga.  Todos los [employer], incluidos vitaminas, hierbas, gotas oftlmicas, cremas y medicamentos de venta libre.  Problemas previos que usted o los [employer] de su familia hayan tenido con el uso de anestsicos.  Enfermedades de la sangre que tenga.  Si tiene cirugas previas.  Enfermedades que tenga.  Probabilidad de embarazo.  Cualquier otro estudio mdico al que se haya sometido en los ltimos 14 das en el que se haya utilizado material de [place]. RIESGOS Y COMPLICACIONES En general, se trata de un procedimiento seguro. Sin embargo, pueden ocurrir [employer], entre los que se pueden incluir los siguientes:  [place] estudio lo expone a una cantidad muy pequea de radiacin.  Los riesgos de la exposicin a la radiacin pueden ser [employer] para los nios por Associate Professor. ANTES DEL PROCEDIMIENTO  No tome ningn suplemento de calcio durante 24 horas antes de [employer]. Puede comer y beber como lo hace habitualmente.  Qutese todas las joyas de metal, anteojos, aparatos dentales y cualquier otro objeto metlico. PROCEDIMIENTO  Deber recostarse en una camilla. Un generador de rayos X estar ubicado debajo de usted y un dispositivo de imgenes, por  encima.  Se pueden usar otros dispositivos, como cajas o abrazaderas, para posicionar el cuerpo apropiadamente para la exploracin.  Deber permanecer inmvil mientras la mquina explore lentamente su cuerpo.  Las imgenes se Web designer monitor de una computadora. DESPUS DEL PROCEDIMIENTO Es posible que necesite estudios adicionales ms adelante.   Esta informacin no tiene Marine scientist el consejo del mdico. Asegrese de hacerle al mdico cualquier pregunta que tenga.   Document Released: 02/20/2012 Document Revised: 06/18/2014 Elsevier Interactive Patient Education [employer].

## 2016-04-02 ENCOUNTER — Ambulatory Visit (INDEPENDENT_AMBULATORY_CARE_PROVIDER_SITE_OTHER): Payer: Medicare Other | Admitting: Otolaryngology

## 2016-04-18 ENCOUNTER — Encounter: Payer: Self-pay | Admitting: Gastroenterology

## 2016-04-23 DIAGNOSIS — M542 Cervicalgia: Secondary | ICD-10-CM | POA: Diagnosis not present

## 2016-04-23 DIAGNOSIS — M791 Myalgia: Secondary | ICD-10-CM | POA: Diagnosis not present

## 2016-04-23 DIAGNOSIS — G43839 Menstrual migraine, intractable, without status migrainosus: Secondary | ICD-10-CM | POA: Diagnosis not present

## 2016-04-23 DIAGNOSIS — R51 Headache: Secondary | ICD-10-CM | POA: Diagnosis not present

## 2016-04-23 DIAGNOSIS — G518 Other disorders of facial nerve: Secondary | ICD-10-CM | POA: Diagnosis not present

## 2016-04-23 DIAGNOSIS — G43719 Chronic migraine without aura, intractable, without status migrainosus: Secondary | ICD-10-CM | POA: Diagnosis not present

## 2016-04-24 ENCOUNTER — Other Ambulatory Visit: Payer: Self-pay | Admitting: Gynecology

## 2016-04-24 ENCOUNTER — Ambulatory Visit (INDEPENDENT_AMBULATORY_CARE_PROVIDER_SITE_OTHER): Payer: Medicare Other

## 2016-04-24 DIAGNOSIS — Z8639 Personal history of other endocrine, nutritional and metabolic disease: Secondary | ICD-10-CM

## 2016-04-24 DIAGNOSIS — M8580 Other specified disorders of bone density and structure, unspecified site: Secondary | ICD-10-CM

## 2016-04-24 DIAGNOSIS — Z780 Asymptomatic menopausal state: Secondary | ICD-10-CM

## 2016-04-24 DIAGNOSIS — Z8739 Personal history of other diseases of the musculoskeletal system and connective tissue: Secondary | ICD-10-CM

## 2016-04-26 DIAGNOSIS — H11151 Pinguecula, right eye: Secondary | ICD-10-CM | POA: Diagnosis not present

## 2016-04-26 DIAGNOSIS — H40003 Preglaucoma, unspecified, bilateral: Secondary | ICD-10-CM | POA: Diagnosis not present

## 2016-04-26 DIAGNOSIS — H2511 Age-related nuclear cataract, right eye: Secondary | ICD-10-CM | POA: Diagnosis not present

## 2016-05-02 DIAGNOSIS — F321 Major depressive disorder, single episode, moderate: Secondary | ICD-10-CM | POA: Diagnosis not present

## 2016-05-07 DIAGNOSIS — I10 Essential (primary) hypertension: Secondary | ICD-10-CM | POA: Diagnosis not present

## 2016-05-07 DIAGNOSIS — R5383 Other fatigue: Secondary | ICD-10-CM | POA: Diagnosis not present

## 2016-05-07 DIAGNOSIS — G43909 Migraine, unspecified, not intractable, without status migrainosus: Secondary | ICD-10-CM | POA: Diagnosis not present

## 2016-05-07 DIAGNOSIS — F329 Major depressive disorder, single episode, unspecified: Secondary | ICD-10-CM | POA: Diagnosis not present

## 2016-05-07 DIAGNOSIS — M79672 Pain in left foot: Secondary | ICD-10-CM | POA: Diagnosis not present

## 2016-05-17 DIAGNOSIS — M79671 Pain in right foot: Secondary | ICD-10-CM | POA: Diagnosis not present

## 2016-05-17 DIAGNOSIS — M7732 Calcaneal spur, left foot: Secondary | ICD-10-CM | POA: Diagnosis not present

## 2016-05-17 DIAGNOSIS — M71572 Other bursitis, not elsewhere classified, left ankle and foot: Secondary | ICD-10-CM | POA: Diagnosis not present

## 2016-05-17 DIAGNOSIS — M722 Plantar fascial fibromatosis: Secondary | ICD-10-CM | POA: Diagnosis not present

## 2016-05-25 DIAGNOSIS — F333 Major depressive disorder, recurrent, severe with psychotic symptoms: Secondary | ICD-10-CM | POA: Diagnosis not present

## 2016-06-07 DIAGNOSIS — F333 Major depressive disorder, recurrent, severe with psychotic symptoms: Secondary | ICD-10-CM | POA: Diagnosis not present

## 2016-07-02 DIAGNOSIS — M71572 Other bursitis, not elsewhere classified, left ankle and foot: Secondary | ICD-10-CM | POA: Diagnosis not present

## 2016-07-02 DIAGNOSIS — M722 Plantar fascial fibromatosis: Secondary | ICD-10-CM | POA: Diagnosis not present

## 2016-07-19 DIAGNOSIS — M71572 Other bursitis, not elsewhere classified, left ankle and foot: Secondary | ICD-10-CM | POA: Diagnosis not present

## 2016-07-19 DIAGNOSIS — M722 Plantar fascial fibromatosis: Secondary | ICD-10-CM | POA: Diagnosis not present

## 2016-07-20 DIAGNOSIS — F333 Major depressive disorder, recurrent, severe with psychotic symptoms: Secondary | ICD-10-CM | POA: Diagnosis not present

## 2016-10-24 ENCOUNTER — Encounter: Payer: Self-pay | Admitting: Gynecology

## 2016-11-29 DIAGNOSIS — F333 Major depressive disorder, recurrent, severe with psychotic symptoms: Secondary | ICD-10-CM | POA: Diagnosis not present

## 2016-12-19 DIAGNOSIS — F333 Major depressive disorder, recurrent, severe with psychotic symptoms: Secondary | ICD-10-CM | POA: Diagnosis not present

## 2016-12-20 DIAGNOSIS — F333 Major depressive disorder, recurrent, severe with psychotic symptoms: Secondary | ICD-10-CM | POA: Diagnosis not present

## 2017-01-18 DIAGNOSIS — H40003 Preglaucoma, unspecified, bilateral: Secondary | ICD-10-CM | POA: Diagnosis not present

## 2017-01-18 DIAGNOSIS — H2511 Age-related nuclear cataract, right eye: Secondary | ICD-10-CM | POA: Diagnosis not present

## 2017-01-18 DIAGNOSIS — H11151 Pinguecula, right eye: Secondary | ICD-10-CM | POA: Diagnosis not present

## 2017-02-21 DIAGNOSIS — F333 Major depressive disorder, recurrent, severe with psychotic symptoms: Secondary | ICD-10-CM | POA: Diagnosis not present

## 2017-02-27 DIAGNOSIS — F333 Major depressive disorder, recurrent, severe with psychotic symptoms: Secondary | ICD-10-CM | POA: Diagnosis not present

## 2017-03-06 DIAGNOSIS — Z1231 Encounter for screening mammogram for malignant neoplasm of breast: Secondary | ICD-10-CM | POA: Diagnosis not present

## 2017-03-12 DIAGNOSIS — Z23 Encounter for immunization: Secondary | ICD-10-CM | POA: Diagnosis not present

## 2017-03-12 DIAGNOSIS — R5383 Other fatigue: Secondary | ICD-10-CM | POA: Diagnosis not present

## 2017-03-12 DIAGNOSIS — Z201 Contact with and (suspected) exposure to tuberculosis: Secondary | ICD-10-CM | POA: Diagnosis not present

## 2017-03-12 DIAGNOSIS — I10 Essential (primary) hypertension: Secondary | ICD-10-CM | POA: Diagnosis not present

## 2017-03-28 DIAGNOSIS — F333 Major depressive disorder, recurrent, severe with psychotic symptoms: Secondary | ICD-10-CM | POA: Diagnosis not present

## 2017-04-05 ENCOUNTER — Ambulatory Visit
Admission: RE | Admit: 2017-04-05 | Discharge: 2017-04-05 | Disposition: A | Discharge status: Home / Self Care | Payer: No Typology Code available for payment source | Source: Ambulatory Visit | Attending: Internal Medicine | Admitting: Internal Medicine

## 2017-04-05 ENCOUNTER — Other Ambulatory Visit: Payer: Self-pay | Admitting: Internal Medicine

## 2017-04-05 DIAGNOSIS — R7611 Nonspecific reaction to tuberculin skin test without active tuberculosis: Secondary | ICD-10-CM

## 2017-04-09 DIAGNOSIS — F333 Major depressive disorder, recurrent, severe with psychotic symptoms: Secondary | ICD-10-CM | POA: Diagnosis not present

## 2017-07-15 DIAGNOSIS — H40003 Preglaucoma, unspecified, bilateral: Secondary | ICD-10-CM | POA: Diagnosis not present

## 2017-07-15 DIAGNOSIS — H2511 Age-related nuclear cataract, right eye: Secondary | ICD-10-CM | POA: Diagnosis not present

## 2017-07-15 DIAGNOSIS — H538 Other visual disturbances: Secondary | ICD-10-CM | POA: Diagnosis not present

## 2017-08-12 ENCOUNTER — Other Ambulatory Visit: Payer: Self-pay

## 2017-08-12 ENCOUNTER — Encounter (HOSPITAL_COMMUNITY): Payer: Self-pay | Admitting: Emergency Medicine

## 2017-08-12 DIAGNOSIS — Z5321 Procedure and treatment not carried out due to patient leaving prior to being seen by health care provider: Secondary | ICD-10-CM | POA: Insufficient documentation

## 2017-08-12 DIAGNOSIS — R21 Rash and other nonspecific skin eruption: Secondary | ICD-10-CM | POA: Diagnosis not present

## 2017-08-12 NOTE — ED Triage Notes (Addendum)
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 No respiratory involvement.  Pt reports taking 2 Benadryl about 1 hour ago

## 2017-08-13 ENCOUNTER — Emergency Department (HOSPITAL_COMMUNITY)
Admission: EM | Admit: 2017-08-13 | Discharge: 2017-08-13 | Disposition: A | Discharge status: Home / Self Care | Payer: Medicare Other | Attending: Emergency Medicine | Admitting: Emergency Medicine

## 2017-08-13 DIAGNOSIS — R7611 Nonspecific reaction to tuberculin skin test without active tuberculosis: Secondary | ICD-10-CM | POA: Diagnosis not present

## 2017-08-13 NOTE — ED Notes (Signed)
No answer x2 

## 2017-08-14 DIAGNOSIS — I10 Essential (primary) hypertension: Secondary | ICD-10-CM | POA: Diagnosis not present

## 2017-08-14 DIAGNOSIS — T7840XD Allergy, unspecified, subsequent encounter: Secondary | ICD-10-CM | POA: Diagnosis not present

## 2017-08-23 DIAGNOSIS — F333 Major depressive disorder, recurrent, severe with psychotic symptoms: Secondary | ICD-10-CM | POA: Diagnosis not present

## 2017-11-21 DIAGNOSIS — F333 Major depressive disorder, recurrent, severe with psychotic symptoms: Secondary | ICD-10-CM | POA: Diagnosis not present

## 2018-02-25 DIAGNOSIS — E559 Vitamin D deficiency, unspecified: Secondary | ICD-10-CM | POA: Diagnosis not present

## 2018-02-25 DIAGNOSIS — I10 Essential (primary) hypertension: Secondary | ICD-10-CM | POA: Diagnosis not present

## 2018-02-25 DIAGNOSIS — Z23 Encounter for immunization: Secondary | ICD-10-CM | POA: Diagnosis not present

## 2018-02-27 DIAGNOSIS — F333 Major depressive disorder, recurrent, severe with psychotic symptoms: Secondary | ICD-10-CM | POA: Diagnosis not present

## 2018-03-21 DIAGNOSIS — Z1231 Encounter for screening mammogram for malignant neoplasm of breast: Secondary | ICD-10-CM | POA: Diagnosis not present

## 2018-05-15 DIAGNOSIS — F333 Major depressive disorder, recurrent, severe with psychotic symptoms: Secondary | ICD-10-CM | POA: Diagnosis not present

## 2018-07-17 DIAGNOSIS — H40003 Preglaucoma, unspecified, bilateral: Secondary | ICD-10-CM | POA: Diagnosis not present

## 2018-07-17 DIAGNOSIS — H2511 Age-related nuclear cataract, right eye: Secondary | ICD-10-CM | POA: Diagnosis not present

## 2018-10-27 DIAGNOSIS — G8929 Other chronic pain: Secondary | ICD-10-CM | POA: Diagnosis not present

## 2018-10-27 DIAGNOSIS — M25562 Pain in left knee: Secondary | ICD-10-CM | POA: Diagnosis not present

## 2018-10-27 DIAGNOSIS — M170 Bilateral primary osteoarthritis of knee: Secondary | ICD-10-CM | POA: Diagnosis not present

## 2018-10-27 DIAGNOSIS — M25561 Pain in right knee: Secondary | ICD-10-CM | POA: Diagnosis not present

## 2018-10-27 DIAGNOSIS — M25461 Effusion, right knee: Secondary | ICD-10-CM | POA: Diagnosis not present

## 2018-11-07 DIAGNOSIS — Z6830 Body mass index (BMI) 30.0-30.9, adult: Secondary | ICD-10-CM | POA: Diagnosis not present

## 2018-11-07 DIAGNOSIS — Z7689 Persons encountering health services in other specified circumstances: Secondary | ICD-10-CM | POA: Diagnosis not present

## 2018-11-07 DIAGNOSIS — M25561 Pain in right knee: Secondary | ICD-10-CM | POA: Diagnosis not present

## 2018-11-07 DIAGNOSIS — M25562 Pain in left knee: Secondary | ICD-10-CM | POA: Diagnosis not present

## 2018-11-07 DIAGNOSIS — E6609 Other obesity due to excess calories: Secondary | ICD-10-CM | POA: Diagnosis not present

## 2018-11-07 DIAGNOSIS — R7301 Impaired fasting glucose: Secondary | ICD-10-CM | POA: Diagnosis not present

## 2018-11-07 DIAGNOSIS — G8929 Other chronic pain: Secondary | ICD-10-CM | POA: Diagnosis not present

## 2018-11-07 DIAGNOSIS — I10 Essential (primary) hypertension: Secondary | ICD-10-CM | POA: Diagnosis not present

## 2018-12-26 DIAGNOSIS — Z1211 Encounter for screening for malignant neoplasm of colon: Secondary | ICD-10-CM | POA: Diagnosis not present

## 2018-12-26 DIAGNOSIS — G43009 Migraine without aura, not intractable, without status migrainosus: Secondary | ICD-10-CM | POA: Diagnosis not present

## 2018-12-26 DIAGNOSIS — Z6830 Body mass index (BMI) 30.0-30.9, adult: Secondary | ICD-10-CM | POA: Diagnosis not present

## 2018-12-26 DIAGNOSIS — F411 Generalized anxiety disorder: Secondary | ICD-10-CM | POA: Diagnosis not present

## 2018-12-26 DIAGNOSIS — F330 Major depressive disorder, recurrent, mild: Secondary | ICD-10-CM | POA: Diagnosis not present

## 2018-12-26 DIAGNOSIS — I10 Essential (primary) hypertension: Secondary | ICD-10-CM | POA: Diagnosis not present

## 2018-12-26 DIAGNOSIS — R7303 Prediabetes: Secondary | ICD-10-CM | POA: Diagnosis not present

## 2018-12-26 DIAGNOSIS — E6609 Other obesity due to excess calories: Secondary | ICD-10-CM | POA: Diagnosis not present

## 2019-02-18 DIAGNOSIS — Z23 Encounter for immunization: Secondary | ICD-10-CM | POA: Diagnosis not present

## 2019-02-18 DIAGNOSIS — M25562 Pain in left knee: Secondary | ICD-10-CM | POA: Diagnosis not present

## 2019-02-18 DIAGNOSIS — G8929 Other chronic pain: Secondary | ICD-10-CM | POA: Diagnosis not present

## 2019-02-18 DIAGNOSIS — Z1211 Encounter for screening for malignant neoplasm of colon: Secondary | ICD-10-CM | POA: Diagnosis not present

## 2019-02-18 DIAGNOSIS — M25561 Pain in right knee: Secondary | ICD-10-CM | POA: Diagnosis not present

## 2019-02-18 DIAGNOSIS — I10 Essential (primary) hypertension: Secondary | ICD-10-CM | POA: Diagnosis not present

## 2019-02-18 DIAGNOSIS — R7303 Prediabetes: Secondary | ICD-10-CM | POA: Diagnosis not present

## 2019-02-18 DIAGNOSIS — Z0000 Encounter for general adult medical examination without abnormal findings: Secondary | ICD-10-CM | POA: Diagnosis not present

## 2019-04-03 DIAGNOSIS — Z1231 Encounter for screening mammogram for malignant neoplasm of breast: Secondary | ICD-10-CM | POA: Diagnosis not present

## 2019-07-28 DIAGNOSIS — H538 Other visual disturbances: Secondary | ICD-10-CM | POA: Diagnosis not present

## 2019-07-28 DIAGNOSIS — H2511 Age-related nuclear cataract, right eye: Secondary | ICD-10-CM | POA: Diagnosis not present

## 2019-07-28 DIAGNOSIS — H11151 Pinguecula, right eye: Secondary | ICD-10-CM | POA: Diagnosis not present

## 2019-07-28 DIAGNOSIS — H40003 Preglaucoma, unspecified, bilateral: Secondary | ICD-10-CM | POA: Diagnosis not present

## 2019-08-20 DIAGNOSIS — R7303 Prediabetes: Secondary | ICD-10-CM | POA: Diagnosis not present

## 2019-08-20 DIAGNOSIS — E782 Mixed hyperlipidemia: Secondary | ICD-10-CM | POA: Diagnosis not present

## 2019-08-20 DIAGNOSIS — Z1159 Encounter for screening for other viral diseases: Secondary | ICD-10-CM | POA: Diagnosis not present

## 2019-08-20 DIAGNOSIS — E6609 Other obesity due to excess calories: Secondary | ICD-10-CM | POA: Diagnosis not present

## 2019-08-20 DIAGNOSIS — I10 Essential (primary) hypertension: Secondary | ICD-10-CM | POA: Diagnosis not present

## 2019-08-20 DIAGNOSIS — Z6830 Body mass index (BMI) 30.0-30.9, adult: Secondary | ICD-10-CM | POA: Diagnosis not present

## 2019-12-24 DIAGNOSIS — R319 Hematuria, unspecified: Secondary | ICD-10-CM | POA: Diagnosis not present

## 2019-12-24 DIAGNOSIS — M47816 Spondylosis without myelopathy or radiculopathy, lumbar region: Secondary | ICD-10-CM | POA: Diagnosis not present

## 2019-12-24 DIAGNOSIS — R195 Other fecal abnormalities: Secondary | ICD-10-CM | POA: Diagnosis not present

## 2019-12-24 DIAGNOSIS — M545 Low back pain: Secondary | ICD-10-CM | POA: Diagnosis not present

## 2020-04-08 DIAGNOSIS — Z1231 Encounter for screening mammogram for malignant neoplasm of breast: Secondary | ICD-10-CM | POA: Diagnosis not present

## 2020-05-18 DIAGNOSIS — H40003 Preglaucoma, unspecified, bilateral: Secondary | ICD-10-CM | POA: Diagnosis not present

## 2020-05-18 DIAGNOSIS — H2511 Age-related nuclear cataract, right eye: Secondary | ICD-10-CM | POA: Diagnosis not present

## 2020-05-18 DIAGNOSIS — M545 Low back pain, unspecified: Secondary | ICD-10-CM | POA: Diagnosis not present

## 2020-05-18 DIAGNOSIS — M25562 Pain in left knee: Secondary | ICD-10-CM | POA: Diagnosis not present

## 2020-05-18 DIAGNOSIS — G8929 Other chronic pain: Secondary | ICD-10-CM | POA: Diagnosis not present

## 2020-05-18 DIAGNOSIS — H538 Other visual disturbances: Secondary | ICD-10-CM | POA: Diagnosis not present

## 2020-05-18 DIAGNOSIS — M25561 Pain in right knee: Secondary | ICD-10-CM | POA: Diagnosis not present

## 2020-05-18 DIAGNOSIS — H11151 Pinguecula, right eye: Secondary | ICD-10-CM | POA: Diagnosis not present

## 2021-01-30 ENCOUNTER — Other Ambulatory Visit: Payer: Self-pay

## 2021-01-30 ENCOUNTER — Ambulatory Visit (INDEPENDENT_AMBULATORY_CARE_PROVIDER_SITE_OTHER): Payer: Medicare Other | Admitting: Family Medicine

## 2021-01-30 ENCOUNTER — Encounter: Payer: Self-pay | Admitting: Family Medicine

## 2021-01-30 ENCOUNTER — Ambulatory Visit: Payer: Self-pay

## 2021-01-30 VITALS — BP 120/82 | Ht 63.0 in | Wt 168.0 lb

## 2021-01-30 DIAGNOSIS — M170 Bilateral primary osteoarthritis of knee: Secondary | ICD-10-CM | POA: Diagnosis not present

## 2021-01-30 DIAGNOSIS — M25561 Pain in right knee: Secondary | ICD-10-CM

## 2021-01-30 MED ORDER — PREDNISONE 5 MG PO TABS: ORAL_TABLET | ORAL | 0 refills | Status: AC

## 2021-01-30 NOTE — Patient Instructions (Signed)
Nice to meet you Please use ice as needed  Please try physical therapy  Please hold the mobic while taking the prednisone   Please send me a message in [place] with any questions or updates.  Please see me back in 4 weeks .   --Dr. Carlynn Herald de conocerte [employer] hielo segn sea necesario. Por favor, prueba la fisioterapia. Sostenga el mobic mientras toma la prednisona. Enveme un mensaje en MyChart con cualquier pregunta o actualizacin. Por favor, vuelva a verme en 4 semanas.

## 2021-01-30 NOTE — Progress Notes (Signed)
Kelly Cruz - 65 y.o. female MRN [id number]  Date of birth: [birth year]  SUBJECTIVE:  Including CC & ROS.  No chief complaint on file.   Kelly Cruz is a 65 y.o. female that is presenting with bilateral knee pain.  Pain is acute on chronic in nature.  Pain is worse with walking or going up and down stairs.  It is anterior in nature.  An in person Spanish interpreter was used for this visit  Review of Systems See HPI   HISTORY: Past Medical, Surgical, Social, and Family History Reviewed & Updated per EMR.   Pertinent Historical Findings include:  Past Medical History:  Diagnosis Date   Blood transfusion    over 20 years ago   Hypertension    Incomplete abortion    X2   Migraines    Neonatal death    X2   NSVD (normal spontaneous vaginal delivery)    X8   Vitamin D deficiency     Past Surgical History:  Procedure Laterality Date   CRYOTHERAPY     CERVIX   DILATION AND CURETTAGE OF UTERUS     X2   SHOULDER SURGERY  2008   LEFT SHOULDER FRACTURE. OPEN REDUCTION   TUBAL LIGATION     [place]    Family History  Problem Relation Age of Onset   Hypertension Mother    Thyroid disease Mother     Social History   Socioeconomic History   Marital status: Single    Spouse name: Not on file   Number of children: Not on file   Years of education: Not on file   Highest education level: Not on file  Occupational History   Not on file  Tobacco Use   Smoking status: Never   Smokeless tobacco: Never  Substance and Sexual Activity   Alcohol use: No    Alcohol/week: 0.0 standard drinks   Drug use: No   Sexual activity: Never  Other Topics Concern   Not on file  Social History Narrative   Not on file   Social Determinants of Health   Financial Resource Strain: Not on file  Food Insecurity: Not on file  Transportation Needs: Not on file  Physical Activity: Not on file  Stress: Not on file  Social Connections: Not on file  Intimate Partner Violence: Not on  file     PHYSICAL EXAM:  VS: BP 120/82 (BP Location: Left Arm, Patient Position: Sitting, Cuff Size: Normal)   Ht '5\' 3"'$  (1.6 m)   Wt 168 lb (76.2 kg)   LMP 04/26/2005   BMI 29.76 kg/m  Physical Exam Gen: NAD, alert, cooperative with exam, well-appearing MSK:  Right and left knee: No obvious effusion. Normal range of motion. Does have some valgus change. Neurovascular intact  Limited ultrasound: Right knee, left knee:  Right knee: No effusion suprapatellar pouch Normal-appearing quadricep patellar tendon. Normal-appearing medial joint space. Narrowing of the lateral joint space and effusion noted over the lateral femoral condyle.  Left knee: No effusion in the suprapatellar pouch. Degenerative changes in the lateral compartment with effusion of the lateral femoral condyle  Summary: Degenerative changes in the lateral compartment bilaterally  Ultrasound and interpretation by Clearance Coots, MD    ASSESSMENT & PLAN:   Primary osteoarthritis of both knees Has changes in the lateral compartment of each knee.  Having some weakness on exam as well. -Counseled on home exercise therapy and supportive care. -Prednisone. -Referral to physical therapy. -Could consider injections

## 2021-01-31 DIAGNOSIS — M170 Bilateral primary osteoarthritis of knee: Secondary | ICD-10-CM | POA: Insufficient documentation

## 2021-01-31 NOTE — Assessment & Plan Note (Signed)
Has changes in the lateral compartment of each knee.  Having some weakness on exam as well. -Counseled on home exercise therapy and supportive care. -Prednisone. -Referral to physical therapy. -Could consider injections

## 2021-03-13 ENCOUNTER — Ambulatory Visit (INDEPENDENT_AMBULATORY_CARE_PROVIDER_SITE_OTHER): Payer: Medicare Other | Admitting: Family Medicine

## 2021-03-13 DIAGNOSIS — M170 Bilateral primary osteoarthritis of knee: Secondary | ICD-10-CM

## 2021-03-13 NOTE — Assessment & Plan Note (Signed)
Has pain intermittently.  Degenerative change appreciated.  Has tried steroid injections in the past and imaging with limited success.   -Counseled on home exercise therapy and supportive care. -Pursue gel injections of each knee.

## 2021-03-13 NOTE — Progress Notes (Signed)
  Kelly Cruz - 65 y.o. female MRN [id number]  Date of birth: 1955/12/09  SUBJECTIVE:  Including CC & ROS.  No chief complaint on file.   Kelly Cruz is a 65 y.o. female that is following up for her bilateral knee pain.  She has tried steroid injections in the past.  She wants to avoid having to rely on steroid injections.  An in person Spanish interpreter was used for this visit.  Review of Systems See HPI   HISTORY: Past Medical, Surgical, Social, and Family History Reviewed & Updated per EMR.   Pertinent Historical Findings include:  Past Medical History:  Diagnosis Date   Blood transfusion    over 20 years ago   Hypertension    Incomplete abortion    X2   Migraines    Neonatal death    X2   NSVD (normal spontaneous vaginal delivery)    X8   Vitamin D deficiency     Past Surgical History:  Procedure Laterality Date   CRYOTHERAPY     CERVIX   DILATION AND CURETTAGE OF UTERUS     X2   SHOULDER SURGERY  2008   LEFT SHOULDER FRACTURE. OPEN REDUCTION   TUBAL LIGATION     [place]    Family History  Problem Relation Age of Onset   Hypertension Mother    Thyroid disease Mother     Social History   Socioeconomic History   Marital status: Single    Spouse name: Not on file   Number of children: Not on file   Years of education: Not on file   Highest education level: Not on file  Occupational History   Not on file  Tobacco Use   Smoking status: Never   Smokeless tobacco: Never  Substance and Sexual Activity   Alcohol use: No    Alcohol/week: 0.0 standard drinks   Drug use: No   Sexual activity: Never  Other Topics Concern   Not on file  Social History Narrative   Not on file   Social Determinants of Health   Financial Resource Strain: Not on file  Food Insecurity: Not on file  Transportation Needs: Not on file  Physical Activity: Not on file  Stress: Not on file  Social Connections: Not on file  Intimate Partner Violence: Not on file      PHYSICAL EXAM:  VS: Ht 5\' 5"  (1.651 m)   Wt 165 lb (74.8 kg)   LMP 04/26/2005   BMI 27.46 kg/m  Physical Exam Gen: NAD, alert, cooperative with exam, well-appearing      ASSESSMENT & PLAN:   Primary osteoarthritis of both knees Has pain intermittently.  Degenerative change appreciated.  Has tried steroid injections in the past and imaging with limited success.   -Counseled on home exercise therapy and supportive care. -Pursue gel injections of each knee.

## 2021-03-13 NOTE — Patient Instructions (Signed)
Good to see you Please try ice as needed  Please continue with the home exercises   Please send me a message in MyChart with any questions or updates.  We will call when the gel injections are in.   --Dr. Lorina Rabon bueno verte Por favor, pruebe con hielo segn sea necesario. Contine con los ejercicios en casa. Enveme un mensaje en MyChart con cualquier pregunta o actualizacin. Kelly Cruz inyecciones de gel.

## 2021-03-16 ENCOUNTER — Telehealth: Payer: Self-pay | Admitting: *Deleted

## 2021-03-16 NOTE — Telephone Encounter (Signed)
[employer] benefits for bilateral  Durolane gel injection states patient's Medicare plan will cover 80% of the cost of Durolane 1  injection per knee once in a 6 month period. I tried to verify if her Medicaid plan would cover the rest and 2 different reps I spoke with at [facility] could not advise me either way.   I called pt and spoke to her son. I informed him of above. He states he will have patient call us back with how she wishes to proceed.

## 2021-03-20 NOTE — Telephone Encounter (Signed)
Patient called me and stated I need to call [phone] to verify her Medicaid benefits.

## 2021-03-24 NOTE — Telephone Encounter (Signed)
[place] medicaid form for Durolane 60 mg hyaluronic acid injection faxed to [place] for patient's bilateral knee injections.

## 2021-04-18 ENCOUNTER — Encounter: Payer: Self-pay | Admitting: Family Medicine

## 2021-04-18 ENCOUNTER — Ambulatory Visit: Payer: Self-pay

## 2021-04-18 ENCOUNTER — Ambulatory Visit (INDEPENDENT_AMBULATORY_CARE_PROVIDER_SITE_OTHER): Payer: Medicare Other | Admitting: Family Medicine

## 2021-04-18 VITALS — BP 120/72 | Ht 65.0 in | Wt 165.0 lb

## 2021-04-18 DIAGNOSIS — M170 Bilateral primary osteoarthritis of knee: Secondary | ICD-10-CM

## 2021-04-18 NOTE — Patient Instructions (Signed)
Good to see you Please try ice   Please send me a message in MyChart with any questions or updates.  Please see me back in 4 weeks.   --Dr. Lorina Rabon bueno verte Por favor, prueba con hielo Enveme un mensaje en MyChart con cualquier pregunta o actualizacin. Por favor, vuelve a verme en 4 semanas.

## 2021-04-18 NOTE — Progress Notes (Addendum)
Kelly Cruz - 65 y.o. female MRN [id number]  Date of birth: 10/22/1955  SUBJECTIVE:  Including CC & ROS.  No chief complaint on file.   Kelly Cruz is a 65 y.o. female that is presenting for gel injections.   Review of Systems See HPI   HISTORY: Past Medical, Surgical, Social, and Family History Reviewed & Updated per EMR.   Pertinent Historical Findings include:  Past Medical History:  Diagnosis Date   Blood transfusion    over 20 years ago   Hypertension    Incomplete abortion    X2   Migraines    Neonatal death    X2   NSVD (normal spontaneous vaginal delivery)    X8   Vitamin D deficiency     Past Surgical History:  Procedure Laterality Date   CRYOTHERAPY     CERVIX   DILATION AND CURETTAGE OF UTERUS     X2   SHOULDER SURGERY  2008   LEFT SHOULDER FRACTURE. OPEN REDUCTION   TUBAL LIGATION     [place]    Family History  Problem Relation Age of Onset   Hypertension Mother    Thyroid disease Mother     Social History   Socioeconomic History   Marital status: Single    Spouse name: Not on file   Number of children: Not on file   Years of education: Not on file   Highest education level: Not on file  Occupational History   Not on file  Tobacco Use   Smoking status: Never   Smokeless tobacco: Never  Substance and Sexual Activity   Alcohol use: No    Alcohol/week: 0.0 standard drinks   Drug use: No   Sexual activity: Never  Other Topics Concern   Not on file  Social History Narrative   Not on file   Social Determinants of Health   Financial Resource Strain: Not on file  Food Insecurity: Not on file  Transportation Needs: Not on file  Physical Activity: Not on file  Stress: Not on file  Social Connections: Not on file  Intimate Partner Violence: Not on file     PHYSICAL EXAM:  VS: BP 120/72 (BP Location: Left Arm, Patient Position: Sitting)   Ht 5\' 5"  (1.651 m)   Wt 165 lb (74.8 kg)   LMP 04/26/2005   BMI 27.46 kg/m   Physical Exam Gen: NAD, alert, cooperative with exam, well-appearing    Aspiration/Injection Procedure Note Kelly Cruz [facility] 1956/03/11  Procedure: Injection Indications: left knee pain   Procedure Details Consent: Risks of procedure as well as the alternatives and risks of each were explained to the (patient/caregiver).  Consent for procedure obtained. Time Out: Verified patient identification, verified procedure, site/side was marked, verified correct patient position, special equipment/implants available, medications/allergies/relevent history reviewed, required imaging and test results available.  Performed.  The area was cleaned with iodine and alcohol swabs.    The left knee superior lateral suprapatellar pouch was injected using 4 cc's of 1% lidocaine with a 21 2" needle.  The syringe was switched and a 60 mg/ 3 mL durolane was injected. Ultrasound was used. Images were obtained in  Long views showing the injection.    A sterile dressing was applied.  Patient did tolerate procedure well.  Aspiration/Injection Procedure Note Kelly Cruz YTKZ 08/14/1955  Procedure: Injection Indications: right knee pain   Procedure Details Consent: Risks of procedure as well as the alternatives and risks of each were explained to the (patient/caregiver).  Consent for procedure obtained. Time Out: Verified patient identification, verified procedure, site/side was marked, verified correct patient position, special equipment/implants available, medications/allergies/relevent history reviewed, required imaging and test results available.  Performed.  The area was cleaned with iodine and alcohol swabs.    The right knee superior lateral suprapatellar pouch was injected using 4 cc's of 1% lidocaine with a 21 2" needle.  The syringe was switched and a 60 mg/ 3 mL durolane was injected. Ultrasound was used. Images were obtained in  Long views showing the injection.    A sterile dressing was  applied.  Patient did tolerate procedure well.   ASSESSMENT & PLAN:   Primary osteoarthritis of both knees Completed bilateral gel injections today.  - f/u in 4 weeks.

## 2021-04-18 NOTE — Assessment & Plan Note (Signed)
Completed bilateral gel injections today.  - f/u in 4 weeks.

## 2022-09-27 ENCOUNTER — Encounter: Payer: Self-pay | Admitting: *Deleted
